# Patient Record
Sex: Female | Born: 1985 | Race: Black or African American | Hispanic: No | Marital: Single | Smoking: Current every day smoker
Health system: Southern US, Community
[De-identification: ages and names within clinical notes are randomized; demographics above are authoritative.]

## PROBLEM LIST (undated history)

## (undated) DIAGNOSIS — F29 Unspecified psychosis not due to a substance or known physiological condition: Secondary | ICD-10-CM

## (undated) DIAGNOSIS — F419 Anxiety disorder, unspecified: Secondary | ICD-10-CM

## (undated) DIAGNOSIS — F319 Bipolar disorder, unspecified: Secondary | ICD-10-CM

## (undated) DIAGNOSIS — F603 Borderline personality disorder: Secondary | ICD-10-CM

## (undated) DIAGNOSIS — I10 Essential (primary) hypertension: Secondary | ICD-10-CM

## (undated) DIAGNOSIS — M797 Fibromyalgia: Secondary | ICD-10-CM

## (undated) DIAGNOSIS — B259 Cytomegaloviral disease, unspecified: Secondary | ICD-10-CM

## (undated) DIAGNOSIS — G43909 Migraine, unspecified, not intractable, without status migrainosus: Secondary | ICD-10-CM

## (undated) DIAGNOSIS — F3181 Bipolar II disorder: Secondary | ICD-10-CM

## (undated) HISTORY — PX: TUBAL LIGATION: SHX77

## (undated) HISTORY — PX: ABDOMINAL HYSTERECTOMY: SHX81

---

## 2013-03-26 ENCOUNTER — Emergency Department (HOSPITAL_COMMUNITY)
Admission: EM | Admit: 2013-03-26 | Discharge: 2013-03-26 | Disposition: A | Discharge status: Home / Self Care | Payer: Medicaid Other | Attending: Emergency Medicine | Admitting: Emergency Medicine

## 2013-03-26 ENCOUNTER — Encounter (HOSPITAL_COMMUNITY): Payer: Self-pay | Admitting: Emergency Medicine

## 2013-03-26 DIAGNOSIS — F17200 Nicotine dependence, unspecified, uncomplicated: Secondary | ICD-10-CM | POA: Insufficient documentation

## 2013-03-26 DIAGNOSIS — I10 Essential (primary) hypertension: Secondary | ICD-10-CM | POA: Insufficient documentation

## 2013-03-26 DIAGNOSIS — Z91040 Latex allergy status: Secondary | ICD-10-CM | POA: Insufficient documentation

## 2013-03-26 DIAGNOSIS — G43909 Migraine, unspecified, not intractable, without status migrainosus: Secondary | ICD-10-CM | POA: Insufficient documentation

## 2013-03-26 DIAGNOSIS — F319 Bipolar disorder, unspecified: Secondary | ICD-10-CM | POA: Insufficient documentation

## 2013-03-26 DIAGNOSIS — F411 Generalized anxiety disorder: Secondary | ICD-10-CM | POA: Insufficient documentation

## 2013-03-26 DIAGNOSIS — Z79899 Other long term (current) drug therapy: Secondary | ICD-10-CM | POA: Insufficient documentation

## 2013-03-26 HISTORY — DX: Unspecified psychosis not due to a substance or known physiological condition: F29

## 2013-03-26 HISTORY — DX: Migraine, unspecified, not intractable, without status migrainosus: G43.909

## 2013-03-26 HISTORY — DX: Essential (primary) hypertension: I10

## 2013-03-26 HISTORY — DX: Fibromyalgia: M79.7

## 2013-03-26 HISTORY — DX: Bipolar II disorder: F31.81

## 2013-03-26 HISTORY — DX: Borderline personality disorder: F60.3

## 2013-03-26 HISTORY — DX: Bipolar disorder, unspecified: F31.9

## 2013-03-26 HISTORY — DX: Anxiety disorder, unspecified: F41.9

## 2013-03-26 MED ORDER — METOCLOPRAMIDE HCL 5 MG/ML IJ SOLN
10.0000 mg | Freq: Once | INTRAMUSCULAR | Status: AC
  Administered 2013-03-26: 10 mg via INTRAVENOUS
  Filled 2013-03-26: qty 2

## 2013-03-26 MED ORDER — DIPHENHYDRAMINE HCL 50 MG/ML IJ SOLN
25.0000 mg | Freq: Once | INTRAMUSCULAR | Status: AC
  Administered 2013-03-26: 25 mg via INTRAVENOUS
  Filled 2013-03-26: qty 1

## 2013-03-26 MED ORDER — KETOROLAC TROMETHAMINE 30 MG/ML IJ SOLN
30.0000 mg | Freq: Once | INTRAMUSCULAR | Status: AC
  Administered 2013-03-26: 30 mg via INTRAVENOUS
  Filled 2013-03-26: qty 1

## 2013-03-26 MED ORDER — SODIUM CHLORIDE 0.9 % IV SOLN
INTRAVENOUS | Status: DC
  Administered 2013-03-26: 15:00:00 via INTRAVENOUS

## 2013-03-26 NOTE — ED Notes (Signed)
Pt c/o migraine x 3 days.  States that she vomited last night.

## 2013-03-26 NOTE — ED Notes (Signed)
MD at bedside. 

## 2013-03-26 NOTE — ED Provider Notes (Signed)
History    CSN: [id number] Arrival date & time 03/26/13  1244  First MD Initiated Contact with Patient 03/26/13 1414     Chief Complaint  Patient presents with  . Migraine   HPI Pt has history of migraines.  This episode started a few days ago.  She tried her medications without relief.  The headache is in the back of her head and moves around to her eyes.  She feels a pressure sensation.  No fever.  No neck pain.  No numbness or focal weakness.  The headache became worse so she came to the ED.  Past Medical History  Diagnosis Date  . Migraines   . Hypertension   . Anxiety   . Bipolar 2 disorder   . Borderline personality disorder   . Manic depression   . Psychosis   . Fibromyalgia    Past Surgical History  Procedure Laterality Date  . Tubal ligation    . Abdominal hysterectomy     History reviewed. No pertinent family history. History  Substance Use Topics  . Smoking status: Current Every Day Smoker -- 1.00 packs/day    Types: Cigarettes  . Smokeless tobacco: Not on file  . Alcohol Use: No   OB History   Grav Para Term Preterm Abortions TAB SAB Ect Mult Living                 Review of Systems  All other systems reviewed and are negative.    Allergies  Gabapentin; Lyrica; Solu-medrol; Topamax; and Latex  Home Medications   Current Outpatient Rx  Name  Route  Sig  Dispense  Refill  . albuterol (PROVENTIL HFA;VENTOLIN HFA) 108 (90 BASE) MCG/ACT inhaler   Inhalation   Inhale 2 puffs into the lungs every 6 (six) hours as needed for wheezing or shortness of breath.         . ALPRAZolam (XANAX) 1 MG tablet   Oral   Take 1 mg by mouth 3 (three) times daily as needed for anxiety.         . butalbital-acetaminophen-caffeine (FIORICET, ESGIC) 50-325-40 MG per tablet   Oral   Take 1 tablet by mouth 2 (two) times daily as needed for headache.         . hydrochlorothiazide (HYDRODIURIL) 25 MG tablet   Oral   Take 25 mg by mouth daily.         .  QUEtiapine (SEROQUEL) 300 MG tablet   Oral   Take 300 mg by mouth at bedtime.         Marland Kitchen tiZANidine (ZANAFLEX) 4 MG tablet   Oral   Take 4 mg by mouth every 6 (six) hours as needed (for muscle spasms).          BP 139/92  Pulse 99  Temp(Src) 98.8 F (37.1 C) (Oral)  Resp 18  SpO2 100% Physical Exam  Nursing note and vitals reviewed. Constitutional: She appears well-developed and well-nourished. No distress.  HENT:  Head: Normocephalic and atraumatic.  Right Ear: External ear normal.  Left Ear: External ear normal.  Eyes: Conjunctivae are normal. Right eye exhibits no discharge. Left eye exhibits no discharge. No scleral icterus.  Neck: Neck supple. No tracheal deviation present.  Cardiovascular: Normal rate, regular rhythm and intact distal pulses.   Pulmonary/Chest: Effort normal and breath sounds normal. No stridor. No respiratory distress. She has no wheezes. She has no rales.  Abdominal: Soft. Bowel sounds are normal. She exhibits no distension. There is  no tenderness. There is no rebound and no guarding.  Musculoskeletal: She exhibits no edema and no tenderness.  Neurological: She is alert. She has normal strength. No sensory deficit. Cranial nerve deficit:  no gross defecits noted. She exhibits normal muscle tone. She displays no seizure activity. Coordination normal.  Skin: Skin is warm and dry. No rash noted.  Psychiatric: She has a normal mood and affect.    ED Course  Procedures (including critical care time) Labs Reviewed - No data to display No results found. 1. Migraine     MDM  Pt improved after treatment with migraine cocktail.  Symptoms typical for her usual migraine headache.  At this time there does not appear to be any evidence of an acute emergency medical condition and the patient appears stable for discharge with appropriate outpatient follow up.   Celene Kras, MD 03/26/13 1537

## 2013-06-19 ENCOUNTER — Encounter (HOSPITAL_COMMUNITY): Payer: Self-pay

## 2013-06-19 ENCOUNTER — Emergency Department (HOSPITAL_COMMUNITY)
Admission: EM | Admit: 2013-06-19 | Discharge: 2013-06-19 | Disposition: A | Discharge status: Home / Self Care | Payer: Medicaid Other | Attending: Emergency Medicine | Admitting: Emergency Medicine

## 2013-06-19 DIAGNOSIS — R42 Dizziness and giddiness: Secondary | ICD-10-CM | POA: Insufficient documentation

## 2013-06-19 DIAGNOSIS — Z79899 Other long term (current) drug therapy: Secondary | ICD-10-CM | POA: Insufficient documentation

## 2013-06-19 DIAGNOSIS — F3189 Other bipolar disorder: Secondary | ICD-10-CM | POA: Insufficient documentation

## 2013-06-19 DIAGNOSIS — Z91040 Latex allergy status: Secondary | ICD-10-CM | POA: Insufficient documentation

## 2013-06-19 DIAGNOSIS — F17200 Nicotine dependence, unspecified, uncomplicated: Secondary | ICD-10-CM | POA: Insufficient documentation

## 2013-06-19 DIAGNOSIS — Z8619 Personal history of other infectious and parasitic diseases: Secondary | ICD-10-CM | POA: Insufficient documentation

## 2013-06-19 DIAGNOSIS — F411 Generalized anxiety disorder: Secondary | ICD-10-CM | POA: Insufficient documentation

## 2013-06-19 DIAGNOSIS — G43109 Migraine with aura, not intractable, without status migrainosus: Secondary | ICD-10-CM | POA: Insufficient documentation

## 2013-06-19 DIAGNOSIS — I10 Essential (primary) hypertension: Secondary | ICD-10-CM | POA: Insufficient documentation

## 2013-06-19 DIAGNOSIS — R112 Nausea with vomiting, unspecified: Secondary | ICD-10-CM | POA: Insufficient documentation

## 2013-06-19 DIAGNOSIS — Z8739 Personal history of other diseases of the musculoskeletal system and connective tissue: Secondary | ICD-10-CM | POA: Insufficient documentation

## 2013-06-19 HISTORY — DX: Cytomegaloviral disease, unspecified: B25.9

## 2013-06-19 MED ORDER — SODIUM CHLORIDE 0.9 % IV BOLUS (SEPSIS)
1000.0000 mL | Freq: Once | INTRAVENOUS | Status: AC
  Administered 2013-06-19: 1000 mL via INTRAVENOUS

## 2013-06-19 MED ORDER — KETOROLAC TROMETHAMINE 30 MG/ML IJ SOLN
30.0000 mg | Freq: Once | INTRAMUSCULAR | Status: AC
  Administered 2013-06-19: 30 mg via INTRAVENOUS
  Filled 2013-06-19: qty 1

## 2013-06-19 MED ORDER — DIPHENHYDRAMINE HCL 50 MG/ML IJ SOLN
25.0000 mg | Freq: Once | INTRAMUSCULAR | Status: AC
  Administered 2013-06-19: 25 mg via INTRAVENOUS
  Filled 2013-06-19: qty 1

## 2013-06-19 MED ORDER — METOCLOPRAMIDE HCL 5 MG/ML IJ SOLN
10.0000 mg | Freq: Once | INTRAMUSCULAR | Status: AC
  Administered 2013-06-19: 10 mg via INTRAVENOUS
  Filled 2013-06-19: qty 2

## 2013-06-19 MED ORDER — BUTALBITAL-APAP-CAFFEINE 50-325-40 MG PO TABS: 1.0000 | ORAL_TABLET | Freq: Two times a day (BID) | ORAL | Status: DC | PRN

## 2013-06-19 NOTE — ED Provider Notes (Signed)
CSN: [id number]     Arrival date & time 06/19/13  1834 History   First MD Initiated Contact with Patient 06/19/13 1918     Chief Complaint  Patient presents with  . Migraine  . Emesis   (Consider location/radiation/quality/duration/timing/severity/associated sxs/prior Treatment) Patient is a 27 y.o. female presenting with migraines and vomiting. The history is provided by the patient. No language interpreter was used.  Migraine This is a recurrent problem. The current episode started yesterday. The problem occurs every several days. The problem has been gradually worsening. Associated symptoms include nausea and vomiting. Pertinent negatives include no abdominal pain. She has tried acetaminophen for the symptoms. The treatment provided no relief.  Emesis Severity:  Moderate Duration:  1 day Number of daily episodes:  5 Quality:  Bilious material Able to tolerate:  Liquids How soon after eating does vomiting occur:  10 minutes Recent urination:  Normal Worsened by:  Food smell Associated symptoms: no abdominal pain and no fever   Risk factors: no sick contacts and no travel to endemic areas    Pt is a 27 year old female who presents with a 1 day history of a headache. She reports that she has a history of migraine headaches and started with a unilateral headache yesterday hurting behind her left eye. She reports that she did have an aura, which she described as flashes of light preceding this headache. She has also had associated episodic vomiting. She reports that she has been off of her normal migraine meds due to her medicaid renewal and she has not taken them since July. She has noticed an increase in headaches since she has been off of her meds. She denies any other visual changes or changes in her visual acuity. She denies diarrhea, fever, neck stiffness or recent sick exposure. She reports her pain now to be bilateral, frontal and extending over her head to the back. She denies pain or  sensitivity in her temples. She says that her triggers include stress and lack of sleep. No known food triggers.      Past Medical History  Diagnosis Date  . Migraines   . Hypertension   . Anxiety   . Bipolar 2 disorder   . Borderline personality disorder   . Manic depression   . Psychosis   . Fibromyalgia   . Cytomegalovirus (CMV) viremia    Past Surgical History  Procedure Laterality Date  . Tubal ligation    . Abdominal hysterectomy     History reviewed. No pertinent family history. History  Substance Use Topics  . Smoking status: Current Every Day Smoker -- 1.00 packs/day    Types: Cigarettes  . Smokeless tobacco: Not on file  . Alcohol Use: No   OB History   Grav Para Term Preterm Abortions TAB SAB Ect Mult Living                 Review of Systems  Gastrointestinal: Positive for nausea and vomiting. Negative for abdominal pain.  Neurological: Positive for dizziness.  All other systems reviewed and are negative.    Allergies  Gabapentin; Lyrica; Solu-medrol; Topamax; and Latex  Home Medications   Current Outpatient Rx  Name  Route  Sig  Dispense  Refill  . albuterol (PROVENTIL HFA;VENTOLIN HFA) 108 (90 BASE) MCG/ACT inhaler   Inhalation   Inhale 2 puffs into the lungs every 6 (six) hours as needed for wheezing or shortness of breath.         . ALPRAZolam Prudy Feeler)  1 MG tablet   Oral   Take 1 mg by mouth 3 (three) times daily as needed for anxiety.         . butalbital-acetaminophen-caffeine (FIORICET, ESGIC) 50-325-40 MG per tablet   Oral   Take 1 tablet by mouth 2 (two) times daily as needed for headache.         . hydrochlorothiazide (HYDRODIURIL) 25 MG tablet   Oral   Take 25 mg by mouth daily.         . QUEtiapine (SEROQUEL) 300 MG tablet   Oral   Take 300 mg by mouth at bedtime.         Marland Kitchen tiZANidine (ZANAFLEX) 4 MG tablet   Oral   Take 4 mg by mouth every 6 (six) hours as needed (for muscle spasms).          There were no  vitals taken for this visit. Physical Exam  ED Course  Procedures (including critical care time) Labs Review Labs Reviewed - No data to display Imaging Review No results found.  MDM   1. Migraine with aura     Presents with typical pattern of migraines has been off of her regular meds since July. Relieved by migraine cocktail here in ER. Neuro exam within normal limits. No changes in mental status, no numbness, focal weakness, fever or stiff neck. Gait and coordination normal. Headache was preceded by aura with flashing lights. Otherwise, no visual changes. Reassuring exam.        Irish Elders, NP 06/19/13 2100

## 2013-06-19 NOTE — ED Provider Notes (Signed)
Medical screening examination/treatment/procedure(s) were conducted as a shared visit with non-physician practitioner(s) and myself.  I personally evaluated the patient during the encounter  Please see my separate respective documentation pertaining to this patient encounter   Vida Roller, MD 06/19/13 2117

## 2013-06-19 NOTE — ED Provider Notes (Signed)
27 year old female with a history of frequent headaches presents with a complaint of a headache behind her left eye radiating over the back of her head to her neck. There is no stiff neck, no fever, no chills. She does have associated photophobia as well as nausea but on my exam appears very comfortable, is actively using her cell phone, has her legs crossed, fallows all my commands, is smiling and interactive during the exam. There is no meningismus, soft abdomen, clear heart and lung sounds, normal strength and sensation of all 4 extremities and normal cranial nerves III through XII. The patient does appear stable for discharge after getting some symptomatic medications and a refill on her Fioricet which she states she is out of.  Medical screening examination/treatment/procedure(s) were conducted as a shared visit with non-physician practitioner(s) and myself.  I personally evaluated the patient during the encounter.  Clinical Impression: Headache      Vida Roller, MD 06/19/13 2010

## 2013-06-19 NOTE — ED Notes (Signed)
Pt reports intermittent vomiting and migraine headache x3 weeks, woke up this am with tremors. Pt reports she is currently waiting for her Medicaid to be renewed and has been out of her migraine medication

## 2013-06-27 ENCOUNTER — Emergency Department (HOSPITAL_COMMUNITY)
Admission: EM | Admit: 2013-06-27 | Discharge: 2013-06-28 | Disposition: A | Discharge status: Home / Self Care | Payer: Medicaid Other | Attending: Emergency Medicine | Admitting: Emergency Medicine

## 2013-06-27 DIAGNOSIS — IMO0001 Reserved for inherently not codable concepts without codable children: Secondary | ICD-10-CM | POA: Insufficient documentation

## 2013-06-27 DIAGNOSIS — F411 Generalized anxiety disorder: Secondary | ICD-10-CM | POA: Insufficient documentation

## 2013-06-27 DIAGNOSIS — N83209 Unspecified ovarian cyst, unspecified side: Secondary | ICD-10-CM | POA: Insufficient documentation

## 2013-06-27 DIAGNOSIS — I10 Essential (primary) hypertension: Secondary | ICD-10-CM | POA: Insufficient documentation

## 2013-06-27 DIAGNOSIS — R509 Fever, unspecified: Secondary | ICD-10-CM | POA: Insufficient documentation

## 2013-06-27 DIAGNOSIS — F3189 Other bipolar disorder: Secondary | ICD-10-CM | POA: Insufficient documentation

## 2013-06-27 DIAGNOSIS — R1032 Left lower quadrant pain: Secondary | ICD-10-CM | POA: Insufficient documentation

## 2013-06-27 DIAGNOSIS — Z8619 Personal history of other infectious and parasitic diseases: Secondary | ICD-10-CM | POA: Insufficient documentation

## 2013-06-27 DIAGNOSIS — Z79899 Other long term (current) drug therapy: Secondary | ICD-10-CM | POA: Insufficient documentation

## 2013-06-27 DIAGNOSIS — R112 Nausea with vomiting, unspecified: Secondary | ICD-10-CM | POA: Insufficient documentation

## 2013-06-27 DIAGNOSIS — Z90710 Acquired absence of both cervix and uterus: Secondary | ICD-10-CM | POA: Insufficient documentation

## 2013-06-27 DIAGNOSIS — R5381 Other malaise: Secondary | ICD-10-CM | POA: Insufficient documentation

## 2013-06-27 DIAGNOSIS — M549 Dorsalgia, unspecified: Secondary | ICD-10-CM | POA: Insufficient documentation

## 2013-06-27 DIAGNOSIS — F603 Borderline personality disorder: Secondary | ICD-10-CM | POA: Insufficient documentation

## 2013-06-27 DIAGNOSIS — Z91040 Latex allergy status: Secondary | ICD-10-CM | POA: Insufficient documentation

## 2013-06-27 DIAGNOSIS — R630 Anorexia: Secondary | ICD-10-CM | POA: Insufficient documentation

## 2013-06-27 DIAGNOSIS — R51 Headache: Secondary | ICD-10-CM | POA: Insufficient documentation

## 2013-06-27 DIAGNOSIS — F17200 Nicotine dependence, unspecified, uncomplicated: Secondary | ICD-10-CM | POA: Insufficient documentation

## 2013-06-28 ENCOUNTER — Emergency Department (HOSPITAL_COMMUNITY): Payer: Medicaid Other

## 2013-06-28 ENCOUNTER — Encounter (HOSPITAL_COMMUNITY): Payer: Self-pay | Admitting: Emergency Medicine

## 2013-06-28 LAB — CBC WITH DIFFERENTIAL/PLATELET
Basophils Relative: 0 % (ref 0–1)
Eosinophils Absolute: 0.2 10*3/uL (ref 0.0–0.7)
Eosinophils Relative: 3 % (ref 0–5)
HCT: 37.4 % (ref 36.0–46.0)
Hemoglobin: 13.2 g/dL (ref 12.0–15.0)
MCH: 31.4 pg (ref 26.0–34.0)
MCHC: 35.3 g/dL (ref 30.0–36.0)
MCV: 89 fL (ref 78.0–100.0)
Monocytes Absolute: 0.4 10*3/uL (ref 0.1–1.0)
Monocytes Relative: 5 % (ref 3–12)

## 2013-06-28 LAB — COMPREHENSIVE METABOLIC PANEL
Albumin: 3.6 g/dL (ref 3.5–5.2)
BUN: 11 mg/dL (ref 6–23)
Creatinine, Ser: 1.01 mg/dL (ref 0.50–1.10)
GFR calc Af Amer: 87 mL/min — ABNORMAL LOW (ref >90)
Glucose, Bld: 88 mg/dL (ref 70–99)
Total Protein: 6.7 g/dL (ref 6.0–8.3)

## 2013-06-28 LAB — LIPASE, BLOOD: Lipase: 39 U/L (ref 11–59)

## 2013-06-28 LAB — URINALYSIS, ROUTINE W REFLEX MICROSCOPIC
Ketones, ur: NEGATIVE mg/dL
Protein, ur: NEGATIVE mg/dL
Specific Gravity, Urine: 1.021 (ref 1.005–1.030)
pH: 8 (ref 5.0–8.0)

## 2013-06-28 LAB — GC/CHLAMYDIA PROBE AMP: CT Probe RNA: NEGATIVE

## 2013-06-28 LAB — WET PREP, GENITAL
Trich, Wet Prep: NONE SEEN
Yeast Wet Prep HPF POC: NONE SEEN

## 2013-06-28 MED ORDER — DIPHENHYDRAMINE HCL 50 MG/ML IJ SOLN
12.5000 mg | Freq: Once | INTRAMUSCULAR | Status: AC
  Administered 2013-06-28: 12.5 mg via INTRAVENOUS
  Filled 2013-06-28: qty 1

## 2013-06-28 MED ORDER — HYDROMORPHONE HCL PF 1 MG/ML IJ SOLN
1.0000 mg | Freq: Once | INTRAMUSCULAR | Status: AC
  Administered 2013-06-28: 1 mg via INTRAVENOUS
  Filled 2013-06-28: qty 1

## 2013-06-28 MED ORDER — ONDANSETRON HCL 4 MG/2ML IJ SOLN
4.0000 mg | Freq: Once | INTRAMUSCULAR | Status: AC
  Administered 2013-06-28: 4 mg via INTRAVENOUS
  Filled 2013-06-28: qty 2

## 2013-06-28 MED ORDER — OXYCODONE-ACETAMINOPHEN 5-325 MG PO TABS: 1.0000 | ORAL_TABLET | Freq: Four times a day (QID) | ORAL | Status: DC | PRN

## 2013-06-28 NOTE — ED Notes (Signed)
Pt states for the past 3 weeks everytime she eats anything it makes her vomit  Pt states she has not eaten anything at all today  Pt states she feels tired and all she wants to do is sleep  Pt states she was seen by her dr today and they drew blood but did not get the results yet  Pt states she is having pain in her left upper quadrant and is having pain in her right lower quadrant  Pt states the pain runs down her right leg  Pt states she has been running a fever today  Pt states the back of her neck hurts as well

## 2013-06-28 NOTE — ED Provider Notes (Signed)
CSN: [id number]     Arrival date & time 06/27/13  2243 History   First MD Initiated Contact with Patient 06/28/13 0003     Chief Complaint  Patient presents with  . Emesis   (Consider location/radiation/quality/duration/timing/severity/associated sxs/prior Treatment) Patient is a 27 y.o. female presenting with abdominal pain. The history is provided by the patient.  Abdominal Pain Pain location:  LUQ and LLQ Pain quality: aching, sharp and stabbing   Pain radiates to:  Does not radiate Pain severity:  Moderate Onset quality:  Gradual Duration:  2 weeks Timing:  Constant Progression:  Worsening Chronicity:  New Context: eating and recent illness   Context: not alcohol use, not awakening from sleep, not diet changes and not recent sexual activity   Context comment:  Having diffuse myalgias today and went to PCP office and found to have temp of 103.  Blood work and swabs done.  Swabs neg but blood pending. Relieved by:  Nothing Worsened by:  Eating, vomiting and movement Ineffective treatments:  Acetaminophen Associated symptoms: anorexia, fatigue, fever, nausea and vomiting   Associated symptoms: no chest pain, no cough, no diarrhea, no dysuria, no shortness of breath, no sore throat, no vaginal bleeding and no vaginal discharge   Associated symptoms comment:  Worsening migraine headaches and pain in the neck.  Fever today of 103 and then 104 that resolved with tylenol and motrin Risk factors: no alcohol abuse, no aspirin use, has not had multiple surgeries and not pregnant     Past Medical History  Diagnosis Date  . Migraines   . Hypertension   . Anxiety   . Bipolar 2 disorder   . Borderline personality disorder   . Manic depression   . Psychosis   . Fibromyalgia   . Cytomegalovirus (CMV) viremia    Past Surgical History  Procedure Laterality Date  . Tubal ligation    . Abdominal hysterectomy     History reviewed. No pertinent family history. History  Substance Use  Topics  . Smoking status: Current Every Day Smoker -- 1.00 packs/day    Types: Cigarettes  . Smokeless tobacco: Not on file  . Alcohol Use: No   OB History   Grav Para Term Preterm Abortions TAB SAB Ect Mult Living                 Review of Systems  Constitutional: Positive for fever and fatigue.  HENT: Negative for sore throat.   Respiratory: Negative for cough and shortness of breath.   Cardiovascular: Negative for chest pain.  Gastrointestinal: Positive for nausea, vomiting, abdominal pain and anorexia. Negative for diarrhea.  Genitourinary: Negative for dysuria, vaginal bleeding and vaginal discharge.  Musculoskeletal: Positive for myalgias and back pain.  Neurological: Positive for headaches. Negative for weakness.  All other systems reviewed and are negative.    Allergies  Gabapentin; Lyrica; Solu-medrol; Topamax; and Latex  Home Medications   Current Outpatient Rx  Name  Route  Sig  Dispense  Refill  . albuterol (PROVENTIL HFA;VENTOLIN HFA) 108 (90 BASE) MCG/ACT inhaler   Inhalation   Inhale 2 puffs into the lungs every 6 (six) hours as needed for wheezing or shortness of breath.         . ALPRAZolam (XANAX) 1 MG tablet   Oral   Take 1 mg by mouth 3 (three) times daily as needed for anxiety.         . butalbital-acetaminophen-caffeine (FIORICET, ESGIC) 50-325-40 MG per tablet   Oral   Take  1 tablet by mouth 2 (two) times daily as needed for headache.   30 tablet   1   . QUEtiapine (SEROQUEL) 300 MG tablet   Oral   Take 300 mg by mouth at bedtime.         Marland Kitchen tiZANidine (ZANAFLEX) 4 MG tablet   Oral   Take 4 mg by mouth every 6 (six) hours as needed (for muscle spasms).         . traMADol (ULTRAM) 50 MG tablet   Oral   Take 50 mg by mouth 3 (three) times daily as needed for pain (back pain).           BP 129/80  Pulse 104  Temp(Src) 99.6 F (37.6 C) (Oral)  Resp 15  Ht 5\' 6"  (1.676 m)  Wt 184 lb (83.462 kg)  BMI 29.71 kg/m2  SpO2  98% Physical Exam  Nursing note and vitals reviewed. Constitutional: She is oriented to person, place, and time. She appears well-developed and well-nourished. No distress.  HENT:  Head: Normocephalic and atraumatic.  Mouth/Throat: Oropharynx is clear and moist.  Eyes: Conjunctivae and EOM are normal. Pupils are equal, round, and reactive to light.  Neck: Normal range of motion. Neck supple. Muscular tenderness present. No spinous process tenderness present. No rigidity. Normal range of motion present. No Brudzinski's sign and no Kernig's sign noted.  Pt is able to fully flex and extend the neck without difficulty or distress  Cardiovascular: Normal rate, regular rhythm and intact distal pulses.   No murmur heard. Pulmonary/Chest: Effort normal and breath sounds normal. No respiratory distress. She has no wheezes. She has no rales.  Abdominal: Soft. She exhibits no distension. There is tenderness in the left upper quadrant and left lower quadrant. There is no rebound, no guarding and no CVA tenderness.    Musculoskeletal: Normal range of motion. She exhibits no edema.       Cervical back: She exhibits tenderness. She exhibits no bony tenderness.       Back:  Neurological: She is alert and oriented to person, place, and time.  Skin: Skin is warm and dry. No rash noted. No erythema.  Psychiatric: She has a normal mood and affect. Her behavior is normal.    ED Course  Procedures (including critical care time) Labs Review Labs Reviewed  WET PREP, GENITAL - Abnormal; Notable for the following:    Clue Cells Wet Prep HPF POC MANY (*)    WBC, Wet Prep HPF POC RARE (*)    All other components within normal limits  COMPREHENSIVE METABOLIC PANEL - Abnormal; Notable for the following:    Total Bilirubin 0.2 (*)    GFR calc non Af Amer 75 (*)    GFR calc Af Amer 87 (*)    All other components within normal limits  URINALYSIS, ROUTINE W REFLEX MICROSCOPIC - Abnormal; Notable for the  following:    APPearance CLOUDY (*)    All other components within normal limits  GC/CHLAMYDIA PROBE AMP  CBC WITH DIFFERENTIAL  LIPASE, BLOOD   Imaging Review US Transvaginal Non-ob  06/28/2013   CLINICAL DATA:  Left ovarian pain, question torsion  EXAM: TRANSABDOMINAL AND TRANSVAGINAL ULTRASOUND OF PELVIS  DOPPLER ULTRASOUND OF OVARIES  TECHNIQUE: Both transabdominal and transvaginal ultrasound examinations of the pelvis were performed. Transabdominal technique was performed for global imaging of the pelvis including uterus, ovaries, adnexal regions, and pelvic cul-de-sac.  It was necessary to proceed with endovaginal exam following the transabdominal exam to  visualize the ovaries and adnexa. Color and duplex Doppler ultrasound was utilized to evaluate blood flow to the ovaries.  COMPARISON:  CT 02/05/2013  FINDINGS: Uterus  Measurements: Prior hysterectomy. No fibroids or other mass visualized.  Endometrium  Thickness: N/A  No focal abnormality visualized.  Right ovary  Measurements: 3.2 x 2.5 x 2.4 cm. Small follicles. Normal size and echotexture. No adnexal masses.  Left ovary  Measurements: 3.0 x 2.0 x 1.5 cm. Multiple small follicles. Normal size and echotexture. No adnexal masses.  Pulsed Doppler evaluation of both ovaries demonstrates normal low-resistance arterial and venous waveforms.  Other findings  Moderate complex free fluid noted in the pelvis.  IMPRESSION: Moderate complex free fluid in the pelvis. While nonspecific, this could be related to ruptured ovarian cyst. No evidence of ovarian torsion. Prior hysterectomy.  No sonographic evidence for ovarian torsion.   Electronically Signed   By: Charlett Nose M.D.   On: 06/28/2013 03:33   US Pelvis Complete  06/28/2013   CLINICAL DATA:  Left ovarian pain, question torsion  EXAM: TRANSABDOMINAL AND TRANSVAGINAL ULTRASOUND OF PELVIS  DOPPLER ULTRASOUND OF OVARIES  TECHNIQUE: Both transabdominal and transvaginal ultrasound examinations of the  pelvis were performed. Transabdominal technique was performed for global imaging of the pelvis including uterus, ovaries, adnexal regions, and pelvic cul-de-sac.  It was necessary to proceed with endovaginal exam following the transabdominal exam to visualize the ovaries and adnexa. Color and duplex Doppler ultrasound was utilized to evaluate blood flow to the ovaries.  COMPARISON:  CT 02/05/2013  FINDINGS: Uterus  Measurements: Prior hysterectomy. No fibroids or other mass visualized.  Endometrium  Thickness: N/A  No focal abnormality visualized.  Right ovary  Measurements: 3.2 x 2.5 x 2.4 cm. Small follicles. Normal size and echotexture. No adnexal masses.  Left ovary  Measurements: 3.0 x 2.0 x 1.5 cm. Multiple small follicles. Normal size and echotexture. No adnexal masses.  Pulsed Doppler evaluation of both ovaries demonstrates normal low-resistance arterial and venous waveforms.  Other findings  Moderate complex free fluid noted in the pelvis.  IMPRESSION: Moderate complex free fluid in the pelvis. While nonspecific, this could be related to ruptured ovarian cyst. No evidence of ovarian torsion. Prior hysterectomy.  No sonographic evidence for ovarian torsion.   Electronically Signed   By: Charlett Nose M.D.   On: 06/28/2013 03:33   [place] Art/ven Flow Abd Pelv Doppler  06/28/2013   CLINICAL DATA:  Left ovarian pain, question torsion  EXAM: TRANSABDOMINAL AND TRANSVAGINAL ULTRASOUND OF PELVIS  DOPPLER ULTRASOUND OF OVARIES  TECHNIQUE: Both transabdominal and transvaginal ultrasound examinations of the pelvis were performed. Transabdominal technique was performed for global imaging of the pelvis including uterus, ovaries, adnexal regions, and pelvic cul-de-sac.  It was necessary to proceed with endovaginal exam following the transabdominal exam to visualize the ovaries and adnexa. Color and duplex Doppler ultrasound was utilized to evaluate blood flow to the ovaries.  COMPARISON:  CT 02/05/2013  FINDINGS: Uterus   Measurements: Prior hysterectomy. No fibroids or other mass visualized.  Endometrium  Thickness: N/A  No focal abnormality visualized.  Right ovary  Measurements: 3.2 x 2.5 x 2.4 cm. Small follicles. Normal size and echotexture. No adnexal masses.  Left ovary  Measurements: 3.0 x 2.0 x 1.5 cm. Multiple small follicles. Normal size and echotexture. No adnexal masses.  Pulsed Doppler evaluation of both ovaries demonstrates normal low-resistance arterial and venous waveforms.  Other findings  Moderate complex free fluid noted in the pelvis.  IMPRESSION: Moderate complex free  fluid in the pelvis. While nonspecific, this could be related to ruptured ovarian cyst. No evidence of ovarian torsion. Prior hysterectomy.  No sonographic evidence for ovarian torsion.   Electronically Signed   By: Charlett Nose M.D.   On: 06/28/2013 03:33    MDM  No diagnosis found.  Patient here recently for complaints of frequent migraines with a history of migraine and treated appropriately however returns today after seeing her PCP for worsening headache, diffuse body aches and left-sided pelvic and upper abdominal pain with vomiting. She states she's been unable to hold anything down but denies any dysuria, vaginal discharge or diarrhea. Patient states that her doctor's office today she had a throat swab time a nasal swab, and blood tests drawn. The swabs were negative she states in the past she's had a history of the CMV virus that caused similar symptoms. She is complaining of pain in bilateral trapezius muscles and some paracervical tenderness but has no signs of meningitis. However today she had a temperature of 103 at the doctor's office and then when she got home later that day she had a temperature of 104 for which she took Tylenol and ibuprofen the temperature is defervesced here. She denies any tick exposure low suspicion for [facility] spotted fever or Lyme disease at this time. Patient does have a history of ovarian  problems in July of this year and states that her symptoms today feel similar. On exam she does have left pelvic and left upper quadrant pain.  Low suspicion for meningitis at this time but we'll give pain control for headache. Also will do pelvic exam to further evaluate the abdominal pain as well as CBC, CMP and lipase. Wet prep and GC and Chlamydia pending.  Pt also given nausea meds and fluids.  On pelvic pt had left ovarian tenderness and U/S done for further eval.  3:37 AM Labs all wnl.  UA neg.  U/S showed moderate free fluid in the pelvis most consistent with ruptured ovarian cyst which would explain the pelvic tenderness and vomiting without diarrhea.  FUO but fever has only been for 1 day.  Pt saw PCP and is to return on Monday for f/u on labs.  Will treat pain at this time and have f/u with PCP.  Gwyneth Sprout, MD 06/28/13 [phone]

## 2013-07-17 ENCOUNTER — Emergency Department (HOSPITAL_COMMUNITY)
Admission: EM | Admit: 2013-07-17 | Discharge: 2013-07-17 | Disposition: A | Discharge status: Home / Self Care | Payer: Medicaid Other | Attending: Emergency Medicine | Admitting: Emergency Medicine

## 2013-07-17 DIAGNOSIS — F411 Generalized anxiety disorder: Secondary | ICD-10-CM | POA: Insufficient documentation

## 2013-07-17 DIAGNOSIS — G43909 Migraine, unspecified, not intractable, without status migrainosus: Secondary | ICD-10-CM | POA: Insufficient documentation

## 2013-07-17 DIAGNOSIS — Z79899 Other long term (current) drug therapy: Secondary | ICD-10-CM | POA: Insufficient documentation

## 2013-07-17 DIAGNOSIS — G589 Mononeuropathy, unspecified: Secondary | ICD-10-CM | POA: Insufficient documentation

## 2013-07-17 DIAGNOSIS — F17200 Nicotine dependence, unspecified, uncomplicated: Secondary | ICD-10-CM | POA: Insufficient documentation

## 2013-07-17 DIAGNOSIS — R209 Unspecified disturbances of skin sensation: Secondary | ICD-10-CM | POA: Insufficient documentation

## 2013-07-17 DIAGNOSIS — R5381 Other malaise: Secondary | ICD-10-CM | POA: Insufficient documentation

## 2013-07-17 DIAGNOSIS — Z3202 Encounter for pregnancy test, result negative: Secondary | ICD-10-CM | POA: Insufficient documentation

## 2013-07-17 DIAGNOSIS — F319 Bipolar disorder, unspecified: Secondary | ICD-10-CM | POA: Insufficient documentation

## 2013-07-17 DIAGNOSIS — A6920 Lyme disease, unspecified: Secondary | ICD-10-CM | POA: Insufficient documentation

## 2013-07-17 DIAGNOSIS — G629 Polyneuropathy, unspecified: Secondary | ICD-10-CM

## 2013-07-17 DIAGNOSIS — I10 Essential (primary) hypertension: Secondary | ICD-10-CM | POA: Insufficient documentation

## 2013-07-17 DIAGNOSIS — Z91040 Latex allergy status: Secondary | ICD-10-CM | POA: Insufficient documentation

## 2013-07-17 LAB — URINALYSIS, ROUTINE W REFLEX MICROSCOPIC
Hgb urine dipstick: NEGATIVE
Leukocytes, UA: NEGATIVE
Nitrite: NEGATIVE
Protein, ur: NEGATIVE mg/dL
Specific Gravity, Urine: 1.027 (ref 1.005–1.030)
Urobilinogen, UA: 1 mg/dL (ref 0.0–1.0)
pH: 5.5 (ref 5.0–8.0)

## 2013-07-17 LAB — CBC WITH DIFFERENTIAL/PLATELET
Basophils Absolute: 0 10*3/uL (ref 0.0–0.1)
Basophils Relative: 0 % (ref 0–1)
Eosinophils Absolute: 0.2 10*3/uL (ref 0.0–0.7)
Hemoglobin: 13.2 g/dL (ref 12.0–15.0)
Lymphocytes Relative: 42 % (ref 12–46)
MCH: 29.8 pg (ref 26.0–34.0)
MCHC: 33.6 g/dL (ref 30.0–36.0)
Monocytes Relative: 5 % (ref 3–12)
Neutro Abs: 3.3 10*3/uL (ref 1.7–7.7)
Neutrophils Relative %: 50 % (ref 43–77)
Platelets: 241 10*3/uL (ref 150–400)
RDW: 12.1 % (ref 11.5–15.5)
WBC: 6.7 10*3/uL (ref 4.0–10.5)

## 2013-07-17 LAB — COMPREHENSIVE METABOLIC PANEL
ALT: 11 U/L (ref 0–35)
Albumin: 3.9 g/dL (ref 3.5–5.2)
Alkaline Phosphatase: 48 U/L (ref 39–117)
BUN: 13 mg/dL (ref 6–23)
CO2: 25 mEq/L (ref 19–32)
Calcium: 9.9 mg/dL (ref 8.4–10.5)
Creatinine, Ser: 0.75 mg/dL (ref 0.50–1.10)
GFR calc Af Amer: >90 mL/min (ref >90)
GFR calc non Af Amer: >90 mL/min (ref >90)
Glucose, Bld: 79 mg/dL (ref 70–99)
Sodium: 133 mEq/L — ABNORMAL LOW (ref 135–145)

## 2013-07-17 LAB — POCT PREGNANCY, URINE: Preg Test, Ur: NEGATIVE

## 2013-07-17 MED ORDER — DOXYCYCLINE HYCLATE 100 MG PO TABS
100.0000 mg | ORAL_TABLET | Freq: Once | ORAL | Status: AC
  Administered 2013-07-17: 100 mg via ORAL
  Filled 2013-07-17: qty 1

## 2013-07-17 MED ORDER — SODIUM CHLORIDE 0.9 % IV BOLUS (SEPSIS)
1000.0000 mL | Freq: Once | INTRAVENOUS | Status: AC
  Administered 2013-07-17: 1000 mL via INTRAVENOUS

## 2013-07-17 MED ORDER — METOCLOPRAMIDE HCL 5 MG/ML IJ SOLN
10.0000 mg | Freq: Once | INTRAMUSCULAR | Status: AC
Start: 2013-07-17 — End: 2013-07-17
  Administered 2013-07-17: 10 mg via INTRAVENOUS
  Filled 2013-07-17: qty 2

## 2013-07-17 MED ORDER — PROMETHAZINE HCL 25 MG PO TABS: 25.0000 mg | ORAL_TABLET | Freq: Four times a day (QID) | ORAL | Status: DC | PRN

## 2013-07-17 MED ORDER — DOXYCYCLINE HYCLATE 100 MG PO CAPS: 100.0000 mg | ORAL_CAPSULE | Freq: Two times a day (BID) | ORAL | Status: DC

## 2013-07-17 MED ORDER — IBUPROFEN 400 MG PO TABS: 400.0000 mg | ORAL_TABLET | Freq: Four times a day (QID) | ORAL | Status: DC | PRN

## 2013-07-17 MED ORDER — DIPHENHYDRAMINE HCL 50 MG/ML IJ SOLN
25.0000 mg | Freq: Once | INTRAMUSCULAR | Status: AC
  Administered 2013-07-17: 25 mg via INTRAVENOUS
  Filled 2013-07-17: qty 1

## 2013-07-17 MED ORDER — KETOROLAC TROMETHAMINE 30 MG/ML IJ SOLN
30.0000 mg | Freq: Once | INTRAMUSCULAR | Status: AC
  Administered 2013-07-17: 30 mg via INTRAVENOUS
  Filled 2013-07-17: qty 1

## 2013-07-17 NOTE — ED Notes (Signed)
Pt states that she started having a migraine several weeks ago and her PCP told her to come in because her blood work showed Lyme's disease.

## 2013-07-17 NOTE — ED Provider Notes (Addendum)
CSN: [id number]     Arrival date & time 07/17/13  1832 History   First MD Initiated Contact with Patient 07/17/13 1922     Chief Complaint  Patient presents with  . Migraine  . Lyme's disease    (Consider location/radiation/quality/duration/timing/severity/associated sxs/prior Treatment) HPI Comments: PT comes in with cc of migraines and since her PCP asked her to come to the ER after a + lyme test. Hx of psych disorders and HTN. Apparently has been having some diffuse body aches, headache x 1 month, PCP ordered a LYME and it came back positive. Pt's headaches are diffuse, front to back. The pain is constant, similar to her migraine, throbbing, behind her eye. She has no new vision complains, + nausea with emesis x 4. Pt does indicate having some facial numbness every morning she wakes up. No confusion, no LOC.  Patient is a 27 y.o. female presenting with migraines. The history is provided by the patient.  Migraine Associated symptoms include headaches. Pertinent negatives include no chest pain, no abdominal pain and no shortness of breath.    Past Medical History  Diagnosis Date  . Migraines   . Hypertension   . Anxiety   . Bipolar 2 disorder   . Borderline personality disorder   . Manic depression   . Psychosis   . Fibromyalgia   . Cytomegalovirus (CMV) viremia    Past Surgical History  Procedure Laterality Date  . Tubal ligation    . Abdominal hysterectomy     No family history on file. History  Substance Use Topics  . Smoking status: Current Every Day Smoker -- 1.00 packs/day    Types: Cigarettes  . Smokeless tobacco: Not on file  . Alcohol Use: No   OB History   Grav Para Term Preterm Abortions TAB SAB Ect Mult Living                 Review of Systems  Constitutional: Positive for fatigue. Negative for fever and activity change.  Respiratory: Negative for shortness of breath.   Cardiovascular: Negative for chest pain.  Gastrointestinal: Negative for nausea,  vomiting and abdominal pain.  Genitourinary: Negative for dysuria.  Musculoskeletal: Negative for neck pain.  Skin: Negative for rash.  Neurological: Positive for weakness, numbness and headaches. Negative for seizures.    Allergies  Gabapentin; Lyrica; Solu-medrol; Topamax; and Latex  Home Medications   Current Outpatient Rx  Name  Route  Sig  Dispense  Refill  . albuterol (PROVENTIL HFA;VENTOLIN HFA) 108 (90 BASE) MCG/ACT inhaler   Inhalation   Inhale 2 puffs into the lungs every 6 (six) hours as needed for wheezing or shortness of breath.         . ALPRAZolam (XANAX) 1 MG tablet   Oral   Take 1 mg by mouth 3 (three) times daily as needed for anxiety.         . butalbital-acetaminophen-caffeine (FIORICET, ESGIC) 50-325-40 MG per tablet   Oral   Take 1 tablet by mouth 2 (two) times daily as needed for headache.   30 tablet   1   . nitrofurantoin, macrocrystal-monohydrate, (MACROBID) 100 MG capsule   Oral   Take 100 mg by mouth 2 (two) times daily.         . QUEtiapine (SEROQUEL) 300 MG tablet   Oral   Take 300 mg by mouth at bedtime.         Marland Kitchen tiZANidine (ZANAFLEX) 4 MG tablet   Oral   Take 4  mg by mouth every 6 (six) hours as needed (for muscle spasms).          BP 125/81  Pulse 113  Temp(Src) 98.4 F (36.9 C) (Oral)  SpO2 99% Physical Exam  Nursing note and vitals reviewed. Constitutional: She is oriented to person, place, and time. She appears well-developed and well-nourished.  HENT:  Head: Normocephalic and atraumatic.  Eyes: EOM are normal. Pupils are equal, round, and reactive to light.  Neck: Neck supple.  No frank meningeal signs  Cardiovascular: Normal rate, regular rhythm and normal heart sounds.   No murmur heard. Pulmonary/Chest: Effort normal. No respiratory distress.  Abdominal: Soft. She exhibits no distension. There is tenderness. There is no rebound and no guarding.  LUQ tenderness  Musculoskeletal:  Cerebellar exam is normal  (finger to nose) Sensory exam normal for bilateral upper and lower extremities - and patient is able to discriminate between sharp and dull. Motor exam is 4+/5    Neurological: She is alert and oriented to person, place, and time.  Skin: Skin is warm and dry.    ED Course  Procedures (including critical care time) Labs Review Labs Reviewed  COMPREHENSIVE METABOLIC PANEL  CBC WITH DIFFERENTIAL  URINALYSIS, ROUTINE W REFLEX MICROSCOPIC   Imaging Review No results found.  EKG Interpretation   None       MDM  No diagnosis found.  Pt comes in with cc of headache x 1 month, fatigue, myalgias. She denies any camping trips, rashes, hx of tick bite. PCP had ordered LYME titers, which came back +. I am assuming pcp sent her to the ER with concerns for possible Lyme meningitis.  Pt has no frank meningeal signs, moves neck freely  - but she does indicate some neck "pullin" with her turning the head, and also facial numbness when she wakes up.  Will call ID to see if she needs a LP for these headaches, as i am underwhelmed (h/a x 1 month), but at the same time feel it is better to run this by ID.  10:40 PM ID hasnt called back. I have now watched patient in the ED for several hours.  WE will send her home with Doxy, and pcp follow up. Even if there was meningitis, it would be mild - and so tx would be doxy. Pt to see PCP in 14 days. Return precautions discussed. NO LYME CARDITIS.   Derwood Kaplan, MD 07/17/13 2241  EKG Interpretation     Ventricular Rate:  92 PR Interval:  142 QRS Duration: 81 QT Interval:  347 QTC Calculation: 430 R Axis:   67 Text Interpretation:  Sinus rhythm             Derwood Kaplan, MD 07/17/13 2258

## 2013-11-12 ENCOUNTER — Encounter (HOSPITAL_COMMUNITY): Payer: Self-pay | Admitting: Emergency Medicine

## 2013-11-12 ENCOUNTER — Emergency Department (HOSPITAL_COMMUNITY)
Admission: EM | Admit: 2013-11-12 | Discharge: 2013-11-12 | Disposition: A | Discharge status: Home / Self Care | Payer: Medicaid Other | Attending: Emergency Medicine | Admitting: Emergency Medicine

## 2013-11-12 DIAGNOSIS — M549 Dorsalgia, unspecified: Secondary | ICD-10-CM | POA: Insufficient documentation

## 2013-11-12 DIAGNOSIS — R52 Pain, unspecified: Secondary | ICD-10-CM

## 2013-11-12 DIAGNOSIS — G43909 Migraine, unspecified, not intractable, without status migrainosus: Secondary | ICD-10-CM | POA: Insufficient documentation

## 2013-11-12 DIAGNOSIS — J029 Acute pharyngitis, unspecified: Secondary | ICD-10-CM | POA: Insufficient documentation

## 2013-11-12 DIAGNOSIS — F3189 Other bipolar disorder: Secondary | ICD-10-CM | POA: Insufficient documentation

## 2013-11-12 DIAGNOSIS — Z79899 Other long term (current) drug therapy: Secondary | ICD-10-CM | POA: Insufficient documentation

## 2013-11-12 DIAGNOSIS — F17200 Nicotine dependence, unspecified, uncomplicated: Secondary | ICD-10-CM | POA: Insufficient documentation

## 2013-11-12 DIAGNOSIS — G8929 Other chronic pain: Secondary | ICD-10-CM | POA: Insufficient documentation

## 2013-11-12 DIAGNOSIS — Z91040 Latex allergy status: Secondary | ICD-10-CM | POA: Insufficient documentation

## 2013-11-12 DIAGNOSIS — I10 Essential (primary) hypertension: Secondary | ICD-10-CM | POA: Insufficient documentation

## 2013-11-12 DIAGNOSIS — F411 Generalized anxiety disorder: Secondary | ICD-10-CM | POA: Insufficient documentation

## 2013-11-12 DIAGNOSIS — Z8619 Personal history of other infectious and parasitic diseases: Secondary | ICD-10-CM | POA: Insufficient documentation

## 2013-11-12 MED ORDER — METOCLOPRAMIDE HCL 5 MG/ML IJ SOLN
10.0000 mg | Freq: Once | INTRAMUSCULAR | Status: AC
  Administered 2013-11-12: 10 mg via INTRAVENOUS
  Filled 2013-11-12: qty 2

## 2013-11-12 MED ORDER — ONDANSETRON 8 MG PO TBDP: 8.0000 mg | ORAL_TABLET | Freq: Three times a day (TID) | ORAL | Status: DC | PRN

## 2013-11-12 MED ORDER — KETOROLAC TROMETHAMINE 30 MG/ML IJ SOLN
30.0000 mg | Freq: Once | INTRAMUSCULAR | Status: AC
  Administered 2013-11-12: 30 mg via INTRAVENOUS
  Filled 2013-11-12: qty 1

## 2013-11-12 MED ORDER — SODIUM CHLORIDE 0.9 % IV BOLUS (SEPSIS)
1000.0000 mL | Freq: Once | INTRAVENOUS | Status: AC
  Administered 2013-11-12: 1000 mL via INTRAVENOUS

## 2013-11-12 MED ORDER — DIPHENHYDRAMINE HCL 50 MG/ML IJ SOLN
25.0000 mg | Freq: Once | INTRAMUSCULAR | Status: AC
  Administered 2013-11-12: 25 mg via INTRAVENOUS
  Filled 2013-11-12: qty 1

## 2013-11-12 NOTE — ED Provider Notes (Signed)
CSN: [id number]     Arrival date & time 11/12/13  1934 History   First MD Initiated Contact with Patient 11/12/13 1954     Chief Complaint  Patient presents with  . Migraine  . Sore Throat     (Consider location/radiation/quality/duration/timing/severity/associated sxs/prior Treatment) HPI Rita Lewis is a 28 y.o. female presents to emergency department complaining of typical for her migraine headaches, fibromyalgia pain, back pain. Patient states that her fibromyalgia pain and back pain of chronic, and worsened in the last few days, which she thinks is due to cold weather. She states migraine started yesterday. Pain is in the back of her head. Does not radiate. Denies any aura. Denies any visual changes. Denies any numbness or weakness in extremities. Admits to nausea and vomiting, states she's unable to keep anything down, did not take her medications today. She states she normally takes oxycodone 5 mg for her pain at home, and Xanax for her anxiety has not taken either one of those today. She states she called her doctor today but was unable to see them because of the snow day. States the office was closed. Patient denies any fever, chills. She denies any chest pain or shortness of breath. She denies a cough. No urinary symptoms. No change in her bowels.  Past Medical History  Diagnosis Date  . Migraines   . Hypertension   . Anxiety   . Bipolar 2 disorder   . Borderline personality disorder   . Manic depression   . Psychosis   . Fibromyalgia   . Cytomegalovirus (CMV) viremia    Past Surgical History  Procedure Laterality Date  . Tubal ligation    . Abdominal hysterectomy     Family History  Problem Relation Age of Onset  . Hypertension Other   . Diabetes Other   . Stroke Other    History  Substance Use Topics  . Smoking status: Current Every Day Smoker -- 1.00 packs/day    Types: Cigarettes  . Smokeless tobacco: Not on file  . Alcohol Use: No   OB History   Grav Para  Term Preterm Abortions TAB SAB Ect Mult Living                 Review of Systems  Constitutional: Negative for fever and chills.  HENT: Positive for congestion. Negative for sore throat.   Eyes: Positive for photophobia. Negative for pain, redness and visual disturbance.  Respiratory: Negative for cough, chest tightness and shortness of breath.   Cardiovascular: Negative for chest pain, palpitations and leg swelling.  Gastrointestinal: Positive for nausea and vomiting. Negative for abdominal pain and diarrhea.  Genitourinary: Negative for dysuria, flank pain and pelvic pain.  Musculoskeletal: Positive for arthralgias, back pain and myalgias. Negative for neck pain and neck stiffness.  Skin: Negative for rash.  Neurological: Positive for headaches. Negative for dizziness, weakness and light-headedness.  All other systems reviewed and are negative.      Allergies  Gabapentin; Lyrica; Solu-medrol; Topamax; and Latex  Home Medications   Current Outpatient Rx  Name  Route  Sig  Dispense  Refill  . albuterol (PROVENTIL HFA;VENTOLIN HFA) 108 (90 BASE) MCG/ACT inhaler   Inhalation   Inhale 2 puffs into the lungs every 6 (six) hours as needed for wheezing or shortness of breath.         . ALPRAZolam (XANAX) 1 MG tablet   Oral   Take 1 mg by mouth 3 (three) times daily as needed for anxiety.         .Marland Kitchen  diphenhydrAMINE (BENADRYL) 25 mg capsule   Oral   Take 25 mg by mouth every 6 (six) hours as needed (allergy).         Marland Kitchen oxyCODONE-acetaminophen (ROXICET) 5-325 MG/5ML solution   Oral   Take 5 mLs by mouth every 4 (four) hours as needed for severe pain (pain).         . propranolol (INDERAL) 10 MG tablet   Oral   Take 10 mg by mouth 2 (two) times daily.         . QUEtiapine (SEROQUEL) 300 MG tablet   Oral   Take 300 mg by mouth at bedtime.         Marland Kitchen tiZANidine (ZANAFLEX) 4 MG tablet   Oral   Take 4 mg by mouth every 6 (six) hours as needed (for muscle spasms).           BP 146/90  Pulse 109  Temp(Src) 98.5 F (36.9 C) (Oral)  Resp 20  Ht 5\' 6"  (1.676 m)  Wt 193 lb (87.544 kg)  BMI 31.17 kg/m2  SpO2 98% Physical Exam  Nursing note and vitals reviewed. Constitutional: She is oriented to person, place, and time. She appears well-developed and well-nourished. No distress.  HENT:  Head: Normocephalic and atraumatic.  Right Ear: External ear normal.  Left Ear: External ear normal.  Nose: Nose normal.  Mouth/Throat: Oropharynx is clear and moist. No oropharyngeal exudate.  Eyes: Conjunctivae are normal. Pupils are equal, round, and reactive to light.  Neck: Normal range of motion. Neck supple.  Cardiovascular: Normal rate, regular rhythm and normal heart sounds.   Pulmonary/Chest: Effort normal and breath sounds normal. No respiratory distress. She has no wheezes. She has no rales.  Abdominal: Soft. Bowel sounds are normal. She exhibits no distension. There is no tenderness. There is no rebound.  Musculoskeletal: She exhibits no edema.  Neurological: She is alert and oriented to person, place, and time. No cranial nerve deficit. Coordination normal.  5/5 and equal upper and lower extremity strength bilaterally. Equal grip strength bilaterally. Normal finger to nose and heel to shin. No pronator drift.   Skin: Skin is warm and dry.  Psychiatric: She has a normal mood and affect. Her behavior is normal.    ED Course  Procedures (including critical care time) Labs Review Labs Reviewed - No data to display Imaging Review No results found.  EKG Interpretation   None       MDM   Final diagnoses:  Migraine headache  Generalized pain     Patient is here generalized pain, states this is her fibromyalgia flaring up. Also with migraine that began yesterday. She was unable to take any medications for her symptoms today because she's had nausea vomiting to 2 migraine. I have offered patient IM medications however she states the only thing  that works for her is IV. IV placed, order Toradol, Benadryl, Reglan. Will hydrate. Her neurological exam is normal, I do not think she needs any lab work or imaging. Her headache is typical for her. She has been seen here multiple times for the same.   Pt feeling much better after reglan, benadryl, toradol and 1L of IV fluids. She is tolerating ginger ale in ED. Home with zofran. She as medications at home for pain and migraine. Follow up with pcp.   Filed Vitals:   11/12/13 1940 11/12/13 2220  BP: 146/90 106/68  Pulse: 109 76  Temp: 98.5 F (36.9 C)   TempSrc: Oral  Resp: 20 15  Height: 5\' 6"  (1.676 m)   Weight: 193 lb (87.544 kg)   SpO2: 98% 99%      Rita Rief A Briaunna Grindstaff, PA-C 11/12/13 2300

## 2013-11-12 NOTE — ED Provider Notes (Signed)
Medical screening examination/treatment/procedure(s) were performed by non-physician practitioner and as supervising physician I was immediately available for consultation/collaboration.  EKG Interpretation   None         Rolan BuccoMelanie Anysa Tacey, MD 11/12/13 [phone]

## 2013-11-12 NOTE — ED Notes (Signed)
Pt is c/o migraine headache, sore throat, body aches, vomiting  Pt states she called her dr and was told to come here for IVF and medication for pain and nausea

## 2013-11-12 NOTE — Discharge Instructions (Signed)
Take regular medications as prescribed by your doctor. zofran for nausea as prescribed. Follow up with primary care doctor.   Migraine Headache A migraine headache is an intense, throbbing pain on one or both sides of your head. A migraine can last for 30 minutes to several hours. CAUSES  The exact cause of a migraine headache is not always known. However, a migraine may be caused when nerves in the brain become irritated and release chemicals that cause inflammation. This causes pain. Certain things may also trigger migraines, such as:  Alcohol.  Smoking.  Stress.  Menstruation.  Aged cheeses.  Foods or drinks that contain nitrates, glutamate, aspartame, or tyramine.  Lack of sleep.  Chocolate.  Caffeine.  Hunger.  Physical exertion.  Fatigue.  Medicines used to treat chest pain (nitroglycerine), birth control pills, estrogen, and some blood pressure medicines. SIGNS AND SYMPTOMS  Pain on one or both sides of your head.  Pulsating or throbbing pain.  Severe pain that prevents daily activities.  Pain that is aggravated by any physical activity.  Nausea, vomiting, or both.  Dizziness.  Pain with exposure to bright lights, loud noises, or activity.  General sensitivity to bright lights, loud noises, or smells. Before you get a migraine, you may get warning signs that a migraine is coming (aura). An aura may include:  Seeing flashing lights.  Seeing bright spots, halos, or zig-zag lines.  Having tunnel vision or blurred vision.  Having feelings of numbness or tingling.  Having trouble talking.  Having muscle weakness. DIAGNOSIS  A migraine headache is often diagnosed based on:  Symptoms.  Physical exam.  A CT scan or MRI of your head. These imaging tests cannot diagnose migraines, but they can help rule out other causes of headaches. TREATMENT Medicines may be given for pain and nausea. Medicines can also be given to help prevent recurrent  migraines.  HOME CARE INSTRUCTIONS  Only take over-the-counter or prescription medicines for pain or discomfort as directed by your health care provider. The use of long-term narcotics is not recommended.  Lie down in a dark, quiet room when you have a migraine.  Keep a journal to find out what may trigger your migraine headaches. For example, write down:  What you eat and drink.  How much sleep you get.  Any change to your diet or medicines.  Limit alcohol consumption.  Quit smoking if you smoke.  Get 7 9 hours of sleep, or as recommended by your health care provider.  Limit stress.  Keep lights dim if bright lights bother you and make your migraines worse. SEEK IMMEDIATE MEDICAL CARE IF:   Your migraine becomes severe.  You have a fever.  You have a stiff neck.  You have vision loss.  You have muscular weakness or loss of muscle control.  You start losing your balance or have trouble walking.  You feel faint or pass out.  You have severe symptoms that are different from your first symptoms. MAKE SURE YOU:   Understand these instructions.  Will watch your condition.  Will get help right away if you are not doing well or get worse. Document Released: 09/12/2005 Document Revised: 07/03/2013 Document Reviewed: 05/20/2013 [facility] Patient Information 2014 [place], [place].

## 2014-02-08 ENCOUNTER — Emergency Department (HOSPITAL_COMMUNITY): Payer: Medicaid Other

## 2014-02-08 ENCOUNTER — Emergency Department (HOSPITAL_COMMUNITY)
Admission: EM | Admit: 2014-02-08 | Discharge: 2014-02-08 | Disposition: A | Discharge status: Home / Self Care | Payer: Medicaid Other | Attending: Emergency Medicine | Admitting: Emergency Medicine

## 2014-02-08 ENCOUNTER — Encounter (HOSPITAL_COMMUNITY): Payer: Self-pay | Admitting: Emergency Medicine

## 2014-02-08 DIAGNOSIS — Z8619 Personal history of other infectious and parasitic diseases: Secondary | ICD-10-CM | POA: Insufficient documentation

## 2014-02-08 DIAGNOSIS — G8929 Other chronic pain: Secondary | ICD-10-CM | POA: Insufficient documentation

## 2014-02-08 DIAGNOSIS — M549 Dorsalgia, unspecified: Secondary | ICD-10-CM

## 2014-02-08 DIAGNOSIS — F17200 Nicotine dependence, unspecified, uncomplicated: Secondary | ICD-10-CM | POA: Insufficient documentation

## 2014-02-08 DIAGNOSIS — Y939 Activity, unspecified: Secondary | ICD-10-CM | POA: Insufficient documentation

## 2014-02-08 DIAGNOSIS — F411 Generalized anxiety disorder: Secondary | ICD-10-CM | POA: Insufficient documentation

## 2014-02-08 DIAGNOSIS — F319 Bipolar disorder, unspecified: Secondary | ICD-10-CM | POA: Insufficient documentation

## 2014-02-08 DIAGNOSIS — W19XXXA Unspecified fall, initial encounter: Secondary | ICD-10-CM | POA: Insufficient documentation

## 2014-02-08 DIAGNOSIS — Z79899 Other long term (current) drug therapy: Secondary | ICD-10-CM | POA: Insufficient documentation

## 2014-02-08 DIAGNOSIS — IMO0002 Reserved for concepts with insufficient information to code with codable children: Secondary | ICD-10-CM | POA: Insufficient documentation

## 2014-02-08 DIAGNOSIS — I10 Essential (primary) hypertension: Secondary | ICD-10-CM | POA: Insufficient documentation

## 2014-02-08 DIAGNOSIS — Z91040 Latex allergy status: Secondary | ICD-10-CM | POA: Insufficient documentation

## 2014-02-08 DIAGNOSIS — Y929 Unspecified place or not applicable: Secondary | ICD-10-CM | POA: Insufficient documentation

## 2014-02-08 DIAGNOSIS — G43909 Migraine, unspecified, not intractable, without status migrainosus: Secondary | ICD-10-CM | POA: Insufficient documentation

## 2014-02-08 MED ORDER — HYDROMORPHONE HCL PF 1 MG/ML IJ SOLN
1.0000 mg | Freq: Once | INTRAMUSCULAR | Status: AC
  Administered 2014-02-08: 1 mg via INTRAVENOUS
  Filled 2014-02-08: qty 1

## 2014-02-08 MED ORDER — PROCHLORPERAZINE EDISYLATE 5 MG/ML IJ SOLN
10.0000 mg | Freq: Once | INTRAMUSCULAR | Status: AC
  Administered 2014-02-08: 10 mg via INTRAVENOUS
  Filled 2014-02-08: qty 2

## 2014-02-08 MED ORDER — DIPHENHYDRAMINE HCL 50 MG/ML IJ SOLN
25.0000 mg | Freq: Once | INTRAMUSCULAR | Status: AC
  Administered 2014-02-08: 25 mg via INTRAVENOUS
  Filled 2014-02-08: qty 1

## 2014-02-08 MED ORDER — SODIUM CHLORIDE 0.9 % IV BOLUS (SEPSIS)
1000.0000 mL | Freq: Once | INTRAVENOUS | Status: AC
Start: 2014-02-08 — End: 2014-02-08
  Administered 2014-02-08: 1000 mL via INTRAVENOUS

## 2014-02-08 NOTE — ED Notes (Signed)
Pt c/o migraine since yesterday.  States that she hasn't been able to sleep.  C/o vomiting, sensitivity to light/sound.  Also c/o "pain from waist down".  States pain starts in her hips and radiates down both legs.

## 2014-02-08 NOTE — ED Provider Notes (Signed)
CSN: [id number]     Arrival date & time 02/08/14  [id number] History   First MD Initiated Contact with Patient 02/08/14 [phone]     Chief Complaint  Patient presents with  . Migraine  . Leg Pain    Patient is a 28 y.o. female presenting with migraines. The history is provided by the patient.  Migraine This is a recurrent problem. The current episode started yesterday. The problem has been gradually worsening. Pertinent negatives include no abdominal pain and no shortness of breath. Exacerbated by: light , loud noises. Nothing (She tried her prescription meds without relief.) relieves the symptoms.   the patient also has been having trouble with pain in her lower back that has now radiated down both of her legs. The pain goes from her waist down. A fall today. The symptoms were exacerbated. The patient denies any trouble with incontinence. She denies any numbness. The patient does have history of chronic back trouble. In the past she was seeing a pain management Dr. Quincy Carneshey recommended a procedure however the patient was scared to have that done. Patient also has a history of bipolar disorder. She has been taking her medications except for her cervical. She states she has not been sleeping well recently. She denies any suicidal or homicidal ideation  Past Medical History  Diagnosis Date  . Migraines   . Hypertension   . Anxiety   . Bipolar 2 disorder   . Borderline personality disorder   . Manic depression   . Psychosis   . Fibromyalgia   . Cytomegalovirus (CMV) viremia    Past Surgical History  Procedure Laterality Date  . Tubal ligation    . Abdominal hysterectomy     Family History  Problem Relation Age of Onset  . Hypertension Other   . Diabetes Other   . Stroke Other    History  Substance Use Topics  . Smoking status: Current Every Day Smoker -- 1.00 packs/day    Types: Cigarettes  . Smokeless tobacco: Not on file  . Alcohol Use: No   OB History   Grav Para Term Preterm Abortions  TAB SAB Ect Mult Living                 Review of Systems  Constitutional: Negative for fever.  HENT: Negative for voice change.   Respiratory: Negative for shortness of breath.   Cardiovascular: Negative for palpitations.  Gastrointestinal: Negative for abdominal pain.  Genitourinary: Negative for difficulty urinating.       No incontinence  Musculoskeletal: Negative for joint swelling and neck pain.  Skin: Negative for color change.  Neurological: Negative for weakness and numbness (no paresthesias).       No muscle weakness  Psychiatric/Behavioral: Negative for confusion.  All other systems reviewed and are negative.     Allergies  Gabapentin; Lyrica; Solu-medrol; Topamax; and Latex  Home Medications   Prior to Admission medications   Medication Sig Start Date End Date Taking? Authorizing Provider  ALPRAZolam Prudy Feeler(XANAX) 1 MG tablet Take 1 mg by mouth 3 (three) times daily as needed for anxiety.   Yes Historical Provider, MD  eletriptan (RELPAX) 40 MG tablet Take 40 mg by mouth as needed for migraine or headache. One tablet by mouth at onset of headache. May repeat in 2 hours if headache persists or recurs.   Yes Historical Provider, MD  ondansetron (ZOFRAN ODT) 8 MG disintegrating tablet Take 1 tablet (8 mg total) by mouth every 8 (eight) hours as needed for  nausea or vomiting. 11/12/13  Yes Tatyana A Kirichenko, PA-C  propranolol (INDERAL) 10 MG tablet Take 10 mg by mouth 2 (two) times daily.   Yes Historical Provider, MD  QUEtiapine (SEROQUEL) 300 MG tablet Take 600 mg by mouth at bedtime.    Yes Historical Provider, MD  tiZANidine (ZANAFLEX) 4 MG tablet Take 4 mg by mouth every 6 (six) hours as needed (for muscle spasms).   Yes Historical Provider, MD   BP 107/50  Pulse 84  Temp(Src) 98.3 F (36.8 C) (Oral)  Resp 16  SpO2 99% Physical Exam  Nursing note and vitals reviewed. Constitutional: She appears well-developed and well-nourished. No distress.  HENT:  Head:  Normocephalic and atraumatic.  Right Ear: External ear normal.  Left Ear: External ear normal.  Nose: Nose normal.  Eyes: Conjunctivae and EOM are normal. Right eye exhibits no discharge. Left eye exhibits no discharge. No scleral icterus.  Neck: Neck supple. No tracheal deviation present.  Cardiovascular: Normal rate, regular rhythm and intact distal pulses.   Pulmonary/Chest: Effort normal and breath sounds normal. No stridor. No respiratory distress. She has no wheezes. She has no rales.  Abdominal: Soft. Bowel sounds are normal. She exhibits no distension. There is no tenderness. There is no rebound and no guarding.  Musculoskeletal: She exhibits no edema and no tenderness.       Lumbar back: She exhibits decreased range of motion, pain and spasm. She exhibits no swelling and no edema.  Neurological: She is alert. She has normal strength. She is not disoriented. No cranial nerve deficit (no facial droop, extraocular movements intact, no slurred speech) or sensory deficit. She exhibits normal muscle tone. She displays no seizure activity. Coordination normal.  Reflex Scores:      Patellar reflexes are 2+ on the right side and 2+ on the left side.      Achilles reflexes are 2+ on the right side and 2+ on the left side. Skin: Skin is warm and dry. No rash noted. She is not diaphoretic. No erythema.  Psychiatric: She has a normal mood and affect. Her behavior is normal. Thought content normal.    ED Course  Procedures (including critical care time) Labs Review Labs Reviewed - No data to display  Imaging Review Dg Lumbar Spine Complete  02/08/2014   CLINICAL DATA:  Fall with leg pain.  EXAM: LUMBAR SPINE - COMPLETE 4+ VIEW  COMPARISON:  07/06/2013 abdominal CT.  FINDINGS: There is no evidence of lumbar spine fracture. Alignment is normal. Intervertebral disc spaces are maintained.  IMPRESSION: Negative.   Electronically Signed   By: Tiburcio PeaJonathan  Watts M.D.   On: 02/08/2014 10:25    MDM    Final diagnoses:  Migraine headache  Back pain    Back pain has been a chronic issue.  No injuries noted on xray from her fall.    Migraine improved with treatment.    Regarding insomnia, she should resume her seroquel.  Rec follow up with a PCP    Celene KrasJon R Matthew Pais, MD 02/08/14 [phone]

## 2014-02-08 NOTE — Discharge Instructions (Signed)
Back Pain, Adult Low back pain is very common. About 1 in 5 people have back pain.The cause of low back pain is rarely dangerous. The pain often gets better over time.About half of people with a sudden onset of back pain feel better in just 2 weeks. About 8 in 10 people feel better by 6 weeks.  CAUSES Some common causes of back pain include:  Strain of the muscles or ligaments supporting the spine.  Wear and tear (degeneration) of the spinal discs.  Arthritis.  Direct injury to the back. DIAGNOSIS Most of the time, the direct cause of low back pain is not known.However, back pain can be treated effectively even when the exact cause of the pain is unknown.Answering your caregiver's questions about your overall health and symptoms is one of the most accurate ways to make sure the cause of your pain is not dangerous. If your caregiver needs more information, he or she may order lab work or imaging tests (X-rays or MRIs).However, even if imaging tests show changes in your back, this usually does not require surgery. HOME CARE INSTRUCTIONS For many people, back pain returns.Since low back pain is rarely dangerous, it is often a condition that people can learn to [facility] their own.   Remain active. It is stressful on the back to sit or stand in one place. Do not sit, drive, or stand in one place for more than 30 minutes at a time. Take short walks on level surfaces as soon as pain allows.Try to increase the length of time you walk each day.  Do not stay in bed.Resting more than 1 or 2 days can delay your recovery.  Do not avoid exercise or work.Your body is made to move.It is not dangerous to be active, even though your back may hurt.Your back will likely heal faster if you return to being active before your pain is gone.  Pay attention to your body when you bend and lift. Many people have less discomfortwhen lifting if they bend their knees, keep the load close to their bodies,and  avoid twisting. Often, the most comfortable positions are those that put less stress on your recovering back.  Find a comfortable position to sleep. Use a firm mattress and lie on your side with your knees slightly bent. If you lie on your back, put a pillow under your knees.  Only take over-the-counter or prescription medicines as directed by your caregiver. Over-the-counter medicines to reduce pain and inflammation are often the most helpful.Your caregiver may prescribe muscle relaxant drugs.These medicines help dull your pain so you can more quickly return to your normal activities and healthy exercise.  Put ice on the injured area.  Put ice in a plastic bag.  Place a towel between your skin and the bag.  Leave the ice on for 15-20 minutes, 03-04 times a day for the first 2 to 3 days. After that, ice and heat may be alternated to reduce pain and spasms.  Ask your caregiver about trying back exercises and gentle massage. This may be of some benefit.  Avoid feeling anxious or stressed.Stress increases muscle tension and can worsen back pain.It is important to recognize when you are anxious or stressed and learn ways to manage it.Exercise is a great option. SEEK MEDICAL CARE IF:  You have pain that is not relieved with rest or medicine.  You have pain that does not improve in 1 week.  You have new symptoms.  You are generally not feeling well. SEEK  IMMEDIATE MEDICAL CARE IF:   You have pain that radiates from your back into your legs.  You develop new bowel or bladder control problems.  You have unusual weakness or numbness in your arms or legs.  You develop nausea or vomiting.  You develop abdominal pain.  You feel faint. Document Released: 09/12/2005 Document Revised: 03/13/2012 Document Reviewed: 01/31/2011 [facility] Patient Information 2014 [place], [place].   Emergency Department Resource Guide 1) Find a Doctor and Pay Out of Pocket Although you won't have to find  out who is covered by your insurance plan, it is a good idea to ask around and get recommendations. You will then need to call the office and see if the doctor you have chosen will accept you as a new patient and what types of options they offer for patients who are self-pay. Some doctors offer discounts or will set up payment plans for their patients who do not have insurance, but you will need to ask so you aren't surprised when you get to your appointment.  2) Contact Your Local Health Department Not all health departments have doctors that can see patients for sick visits, but many do, so it is worth a call to see if yours does. If you don't know where your local health department is, you can check in your phone book. The CDC also has a tool to help you locate your state's health department, and many state websites also have listings of all of their local health departments.  3) Find a Walk-in Clinic If your illness is not likely to be very severe or complicated, you may want to try a walk in clinic. These are popping up all over the country in pharmacies, drugstores, and shopping centers. They're usually staffed by nurse practitioners or physician assistants that have been trained to treat common illnesses and complaints. They're usually fairly quick and inexpensive. However, if you have serious medical issues or chronic medical problems, these are probably not your best option.  No Primary Care Doctor: - Call Health Connect at  [phone] - they can help you locate a primary care doctor that  accepts your insurance, provides certain services, etc. - Physician Referral Service- [phone]  Chronic Pain Problems: Organization         Address  Phone   Notes  [facility]  [phone] Patients need to be referred by their primary care doctor.   Medication Assistance: Organization         Address  Phone   Notes  [facility] [address] [place], [place] [address] [phone] --Must be a resident of [facility] -- Must have NO insurance coverage whatsoever (no Medicaid/ Medicare, etc.) -- The pt. MUST have a primary care doctor that directs their care regularly and follows them in the community   MedAssist  [phone]   [employer]  [phone]    Agencies that provide inexpensive medical care: Organization         Address  Phone   Notes  [facility]  [phone]) [phone]   [facility]    [phone]   [facility] [address] [place], [place] [address] [phone]   [facility] [address], [place] [phone]   [facility]anned Parenthood    [phone]) [phone]   [facility]    [phone]   [facility]  [address], [place] Phone:  [phone], Fax:  [phone] Hours of Operation:  9 am - 6 pm, M-F.  Also accepts Medicaid/Medicare and self-pay.  [facility]  [address], [place] Phone: [phone], Fax: [phone]. Hours of Operation:  8:30 am - 5:30 pm, M-F.  Also accepts Medicaid and self-pay.  [facility] [place] [address] [place] Phone: [phone]   [facility] [address] Natasha Bence [place], [place] [phone], Ext. [phone] Mondays & Thursdays: 7-9 AM.  First 15 patients are seen on a first come, first serve basis.    Medicaid-accepting [facility] Providers:  Organization         Address  Phone   Notes  [facility] [address], [place] [phone] Also accepts self-pay patients.  [facility] [address] Laurell Josephs [place], [place]  [phone]   [facility] [address], [place] [phone]   [facility] [address] [place] [phone]   Renaye Rakers  [address], [place]   [phone] Only accepts [place] Access [place] patients after they have their name applied to their card.   Self-Pay (no insurance) in [facility]:  Organization         Address  Phone   Notes  Sickle Cell Patients, [facility] [address] [place], [place] [phone]   [facility] [address] [place], [place] [phone]   [facility]  [address], [place] [phone]   [facility]/Dr. Osei-Bonsu  [address], [place] or [address], [place] [phone] Phone number for both [place] and [place] locations is the same.  [facility] [address], [place] [phone]   [facility] [address] [place] or [address] [phone]   [facility] [address] [place] [phone], phone; [phone], fax Sees patients 1st and 3rd Saturday of every month.  Must not qualify for public or private insurance (i.e. Medicaid, Medicare, [place] Health Choice, Veterans' Benefits)  Household income should be no more than 200% of the poverty level The clinic cannot treat you if you are pregnant or think you are pregnant  Sexually transmitted diseases are not treated at the clinic.   Dental Care: Organization         Address  Phone  Notes  [facility] [address] [place], [place] [phone] Accepts children up to age 48 who are enrolled in [place] or [place] Health Choice; pregnant women with a Medicaid card; and children who have applied for Medicaid or [place] Health Choice, but were declined, whose parents can pay a reduced fee at time of service.  [facility]  [address], [place] [phone] Accepts children up to age 42 who  are enrolled in [place] or [place] Health Choice; pregnant women with a Medicaid card; and children who have applied for Medicaid or  Health Choice, but were declined, whose parents can pay a reduced fee at time of service.  [facility] Adult Dental Access PROGRAM  [address] [place], [place] [phone] Patients  are seen by appointment only. Walk-ins are not accepted. [facility] will see patients 29 years of age and older. Monday - Tuesday (8am-5pm) Most Wednesdays (8:30-5pm) $30 per visit, cash only  [facility]  [address], [place] [facility] [place] [phone] Patients are seen by appointment only. Walk-ins are not accepted. [facility] will see patients 29 years of age and older. One Wednesday Evening (Monthly: Volunteer Based).  $30 per visit, cash only  [employer] [facility] [facility]  [phone] for adults; Children under age 24, call Graduate Pediatric Dentistry at [phone]. Children aged 484-14, please call [phone] to request a pediatric application.  Dental services are provided in all areas of dental care including fillings, crowns and bridges, complete and partial dentures, implants, gum treatment, root canals, and extractions. Preventive care is also provided. Treatment is provided to both adults and children. Patients are selected via a lottery and there is often a waiting list.   [facility] [address], [place]  [phone]) [phone] [email or web address]   [facility] [address], [place], [place] [phone], Ext. [phone] Second and Fourth Thursday of each month, opens at 6:30 AM; Clinic ends at 9 AM.  Patients are seen on a first-come first-served basis, and a limited number are seen during each clinic.   [facility]  [address], [place], [place] [phone]) [phone]   Eligibility Requirements You must have lived in [place], [place], or [place] for at least the last three months.   You  cannot be eligible for state or federal sponsored [place] Cityhealthcare insurance, including CIGNAVeterans Administration, [place]icaid, or [employer] EntertainmentMedicare.   You generally cannot be eligible for healthcare insurance through your employer.    How to apply: Eligibility screenings are held every Tuesday and Wednesday afternoon from 1:00 pm until 4:00 pm. You do not need an appointment for the interview!  [facility] [address], [place], [place] [phone]   [facility]  [phone]   [facility]  [phone]   [facility]  [phone]    Behavioral Health Resources in the Community: Intensive Outpatient Programs Organization         Address  Phone  Notes  [facility] [address], [place], [place] [phone]   [facility] Outpatient [address], [place], [place] [phone]   [facility] [address], [place], [place]  [phone]   [facility] [address],  [place], [place] [phone] or [phone]   Substance Abuse Resources Organization         Address  Phone  Notes  [facility]  [phone]   [facility]  [phone]   [facility]  [phone]   [facility]  [phone]   Residential & Outpatient Substance Abuse Program  [phone]   Psychological Services Organization         Address  Phone  Notes  [facility]  [phone]   [facility]  [phone]- [phone]   [facility] [address], [place] [phone] or [phone]    Mobile Crisis Teams Organization         Address  Phone  Notes  [facility], Mobile Crisis Care Unit  [phone]   [facility]  [address] [place], [place] [phone]   [facility] [address] [place]  [place] [phone]    Self-Help/Support  Groups Organization         Address  Phone             Notes  [facility] [place] - variety of support groups  [phone]- [phone] Call for more information  Narcotics Anonymous (NA), [facility] [address], [employer] [place]  2 meetings at this location   Statistician         Address  Phone  Notes  [facility] [address],    [place]  [phone]   [facility]  [address], [place] [address], [place], [place] [phone]   [facility] [address] [place], [place] [phone] Admissions: 8am-3pm M-F  [facility] [address]    [place], [place] [phone]   [facility] [address] [place], [place], [place] [phone]   [facility] [address]  [place], [place] [phone]   [facility] - Intensive Outpatient [address] Laurell Josephs [address], [place], [place] [phone]   [facility] ([facility] [address] [place].,  [place], [place] [phone] or [phone]   [facility] ([facility]) [address] [place], [place] [phone] Accepts Medicaid  [facility] [address]  [place] [phone] Substance Abuse/Addiction Treatment   [facility] Organization         Address  Phone  Notes  [facility]  [phone]   Angie Fava, PhD [address] Ervin Knack [place], [place]   [phone] or [phone]   [facility]   [address] [place], [place] [phone]   [facility] [address] [place], [place] [phone] Insurance/Medicaid/sponsorship through [facility] and Families [address]                                    [place], [place] [phone] Therapy/tele-psych/case  [facility] [address], [place] [phone]    Dr. Lolly Mustache  [phone]   [facility]  United [employer] [facility] Dept. 1) [address], [place] 2) [address] [place] 3)  [address], [place] [phone]  [phone]   [facility] Child Abuse Hotline [phone] or [phone] (After Hours)       Migraine Headache A migraine headache is an intense, throbbing pain on one or both sides of your head. A migraine can last for 30 minutes to several hours. CAUSES  The exact cause of a migraine headache is not always known. However, a migraine may be caused when nerves in the brain become irritated and release chemicals that cause inflammation. This causes pain. Certain things may also trigger migraines, such as:  Alcohol.  Smoking.  Stress.  Menstruation.  Aged cheeses.  Foods or drinks that contain nitrates, glutamate, aspartame, or tyramine.  Lack of sleep.  Chocolate.  Caffeine.  Hunger.  Physical exertion.  Fatigue.  Medicines used to treat chest pain (nitroglycerine), birth control pills, estrogen, and some blood pressure medicines. SIGNS AND SYMPTOMS  Pain on one or both sides of your head.  Pulsating or throbbing pain.  Severe pain that prevents daily activities.  Pain that is aggravated by any physical activity.  Nausea,  vomiting, or both.  Dizziness.  Pain with exposure to bright lights, loud noises, or activity.  General sensitivity to bright lights, loud noises, or smells. Before you get a migraine, you may get warning signs that a migraine is coming (aura). An aura may include:  Seeing flashing lights.  Seeing bright spots, halos, or zig-zag lines.  Having tunnel vision or blurred vision.  Having feelings of numbness or tingling.  Having trouble talking.  Having muscle weakness. DIAGNOSIS  A migraine headache is often diagnosed based on:  Symptoms.  Physical exam.  A CT scan or MRI of your head. These imaging tests cannot diagnose migraines, but they  can help rule out other causes of headaches. TREATMENT Medicines may be given for pain and nausea. Medicines can also be given to help prevent recurrent migraines.  HOME CARE INSTRUCTIONS  Only take over-the-counter or prescription medicines for pain or discomfort as directed by your health care provider. The use of long-term narcotics is not recommended.  Lie down in a dark, quiet room when you have a migraine.  Keep a journal to find out what may trigger your migraine headaches. For example, write down:  What you eat and drink.  How much sleep you get.  Any change to your diet or medicines.  Limit alcohol consumption.  Quit smoking if you smoke.  Get 7 9 hours of sleep, or as recommended by your health care provider.  Limit stress.  Keep lights dim if bright lights bother you and make your migraines worse. SEEK IMMEDIATE MEDICAL CARE IF:   Your migraine becomes severe.  You have a fever.  You have a stiff neck.  You have vision loss.  You have muscular weakness or loss of muscle control.  You start losing your balance or have trouble walking.  You feel faint or pass out.  You have severe symptoms that are different from your first symptoms. MAKE SURE YOU:   Understand these instructions.  Will watch your condition.  Will get help right away if you are not doing well or get worse. Document Released: 09/12/2005 Document Revised: 07/03/2013 Document Reviewed: 05/20/2013 [facility] Patient Information 2014 [place], [place].

## 2014-10-08 IMAGING — US US PELVIS COMPLETE
1 series · 13 of 25 positions shown · non-contrast
Comparison: CT 02/05/2013

CLINICAL DATA: Left ovarian pain, question torsion

EXAM:
TRANSABDOMINAL AND TRANSVAGINAL ULTRASOUND OF PELVIS
DOPPLER ULTRASOUND OF OVARIES
TECHNIQUE: Both transabdominal and transvaginal ultrasound examinations of the
pelvis were performed. Transabdominal technique was performed for
global imaging of the pelvis including uterus, ovaries, adnexal
regions, and pelvic cul-de-sac.
It was necessary to proceed with endovaginal exam following the
transabdominal exam to visualize the ovaries and adnexa. Color and
duplex Doppler ultrasound was utilized to evaluate blood flow to the
ovaries.

[Series 1: us pelvis complete · 0.24mm/px · 13 of 28 slices shown]
[im 1/28]
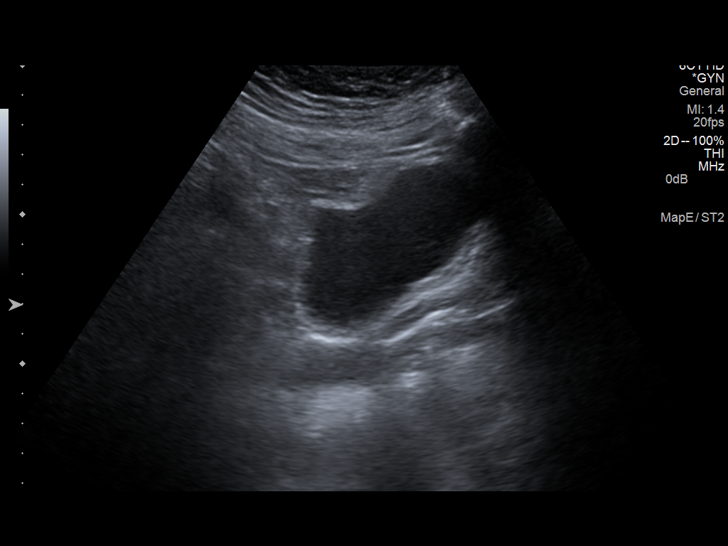
[im 3/28]
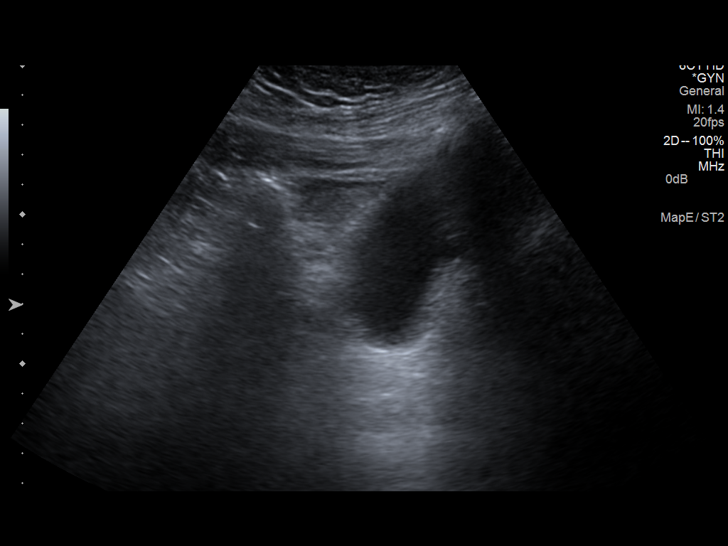
[im 5/28]
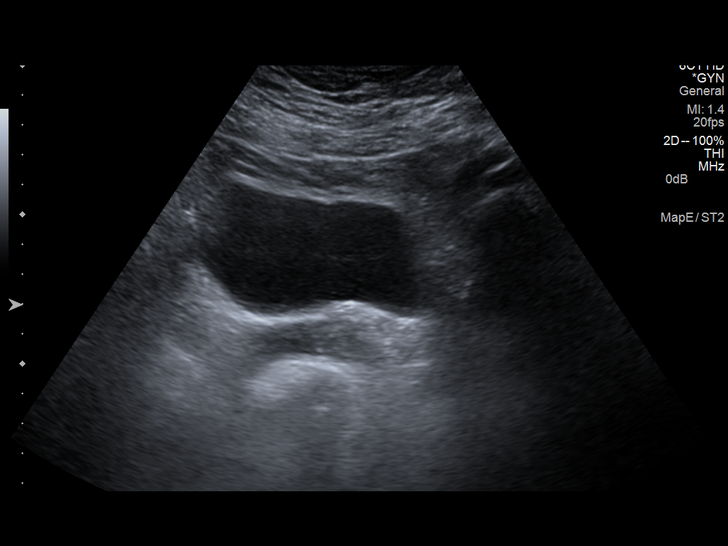
[im 7/28]
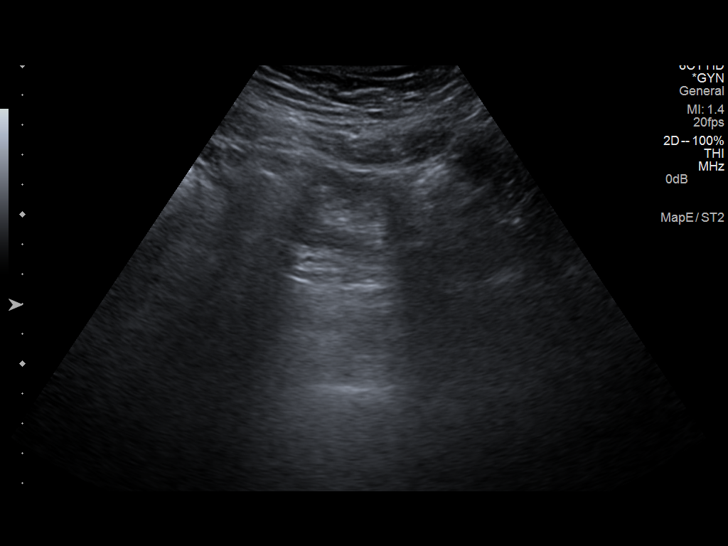
[im 10/28]
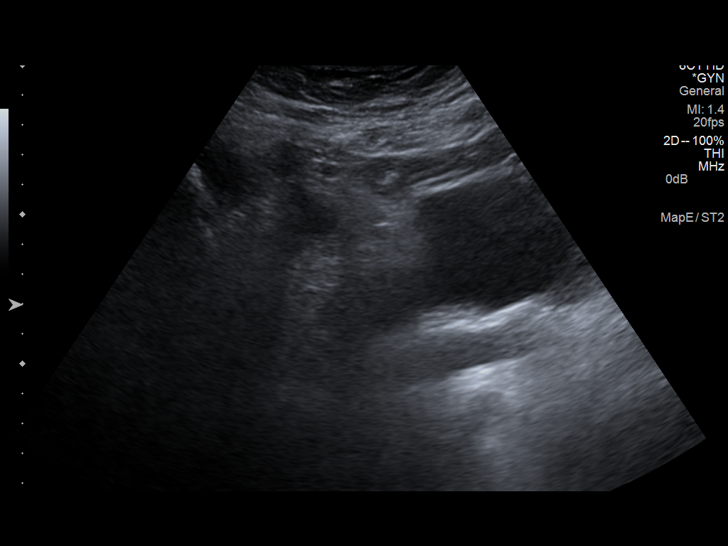
[im 12/28]
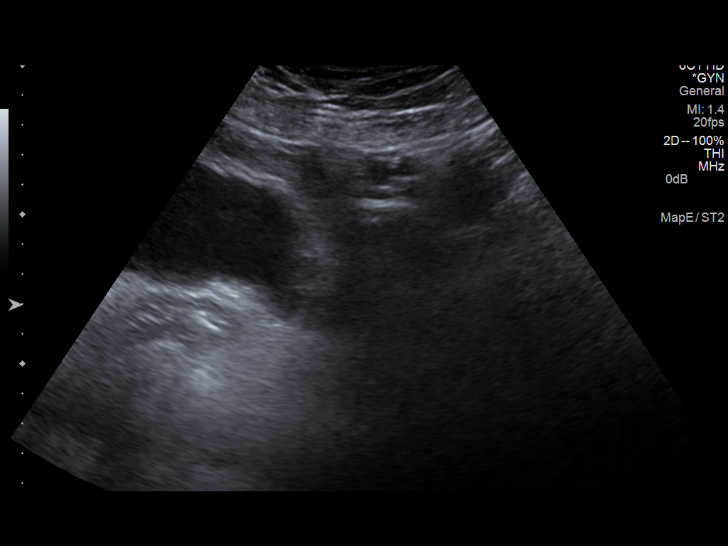
[im 14/28]
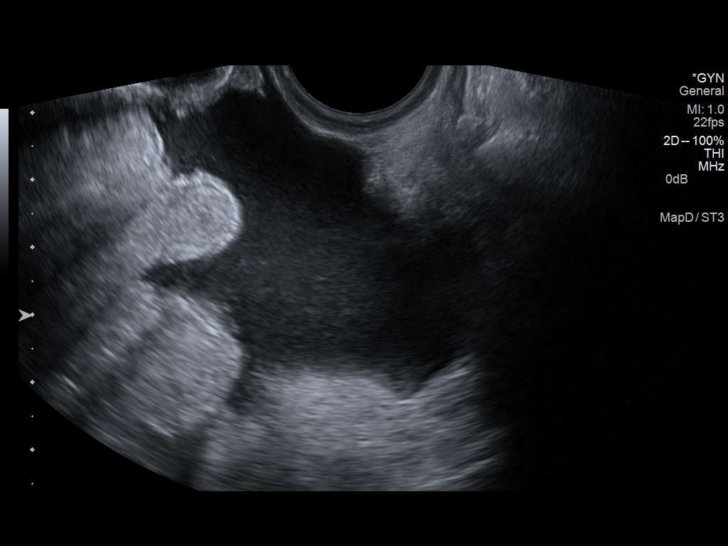
[im 16/28]
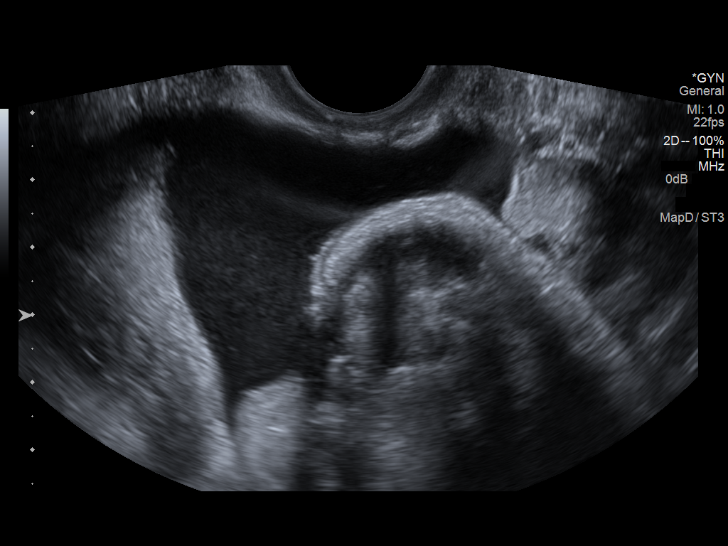
[im 19/28]
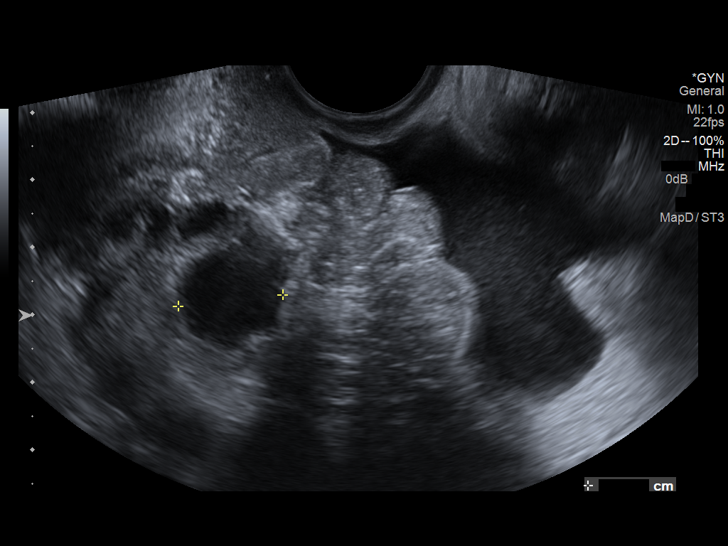
[im 21/28]
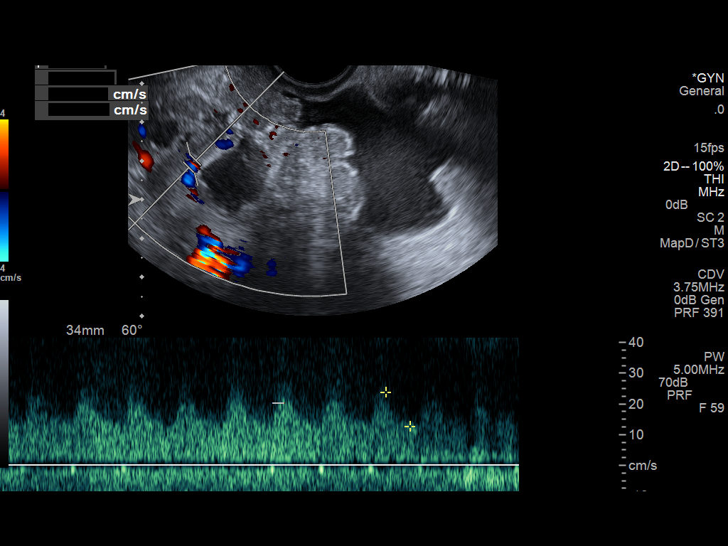
[im 23/28]
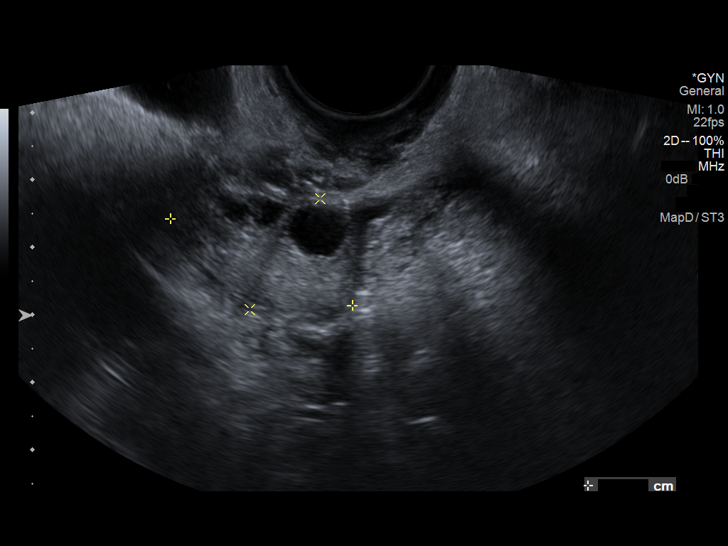
[im 25/28]
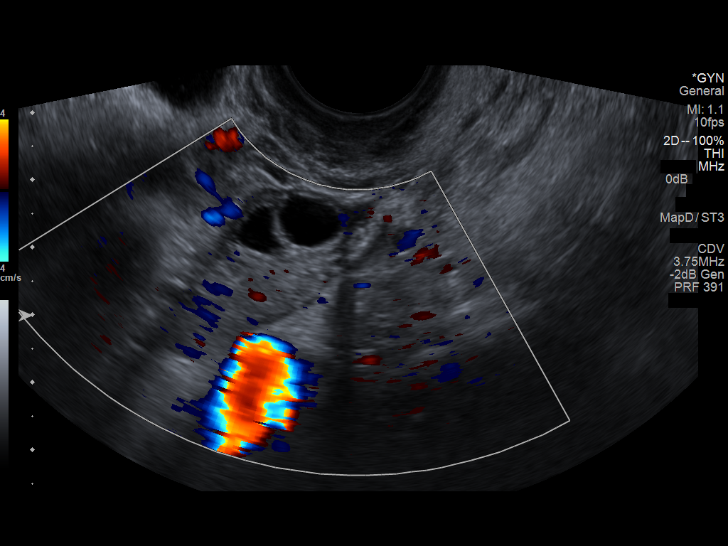
[im 28/28]
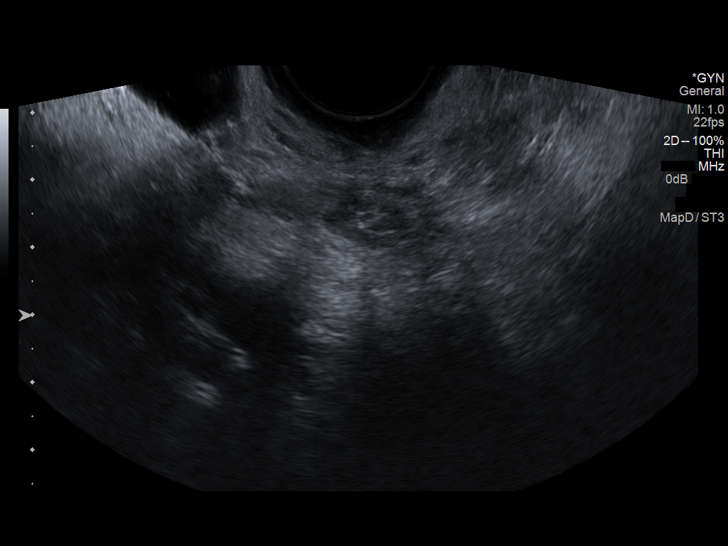

[13 of 25 positions shown; findings below may reference images not displayed]

FINDINGS: Uterus

Measurements: Prior hysterectomy. No fibroids or other mass
visualized.

Endometrium

Thickness: N/A  No focal abnormality visualized.

Right ovary

Measurements: 3.2 x 2.5 x 2.4 cm. Small follicles. Normal size and
echotexture. No adnexal masses.

Left ovary

Measurements: 3.0 x 2.0 x 1.5 cm. Multiple small follicles. Normal
size and echotexture. No adnexal masses.

Pulsed Doppler evaluation of both ovaries demonstrates normal
low-resistance arterial and venous waveforms.

Other findings

Moderate complex free fluid noted in the pelvis.
IMPRESSION: Moderate complex free fluid in the pelvis. While nonspecific, this
could be related to ruptured ovarian cyst. No evidence of ovarian
torsion. Prior hysterectomy.

No sonographic evidence for ovarian torsion.

## 2015-05-21 IMAGING — CR DG LUMBAR SPINE COMPLETE 4+V
5 series · 5 of 5 positions shown · non-contrast
Comparison: 07/06/2013 abdominal CT.

CLINICAL DATA: Fall with leg pain.

EXAM:
LUMBAR SPINE - COMPLETE 4+ VIEW

[t lumbar spine ap]
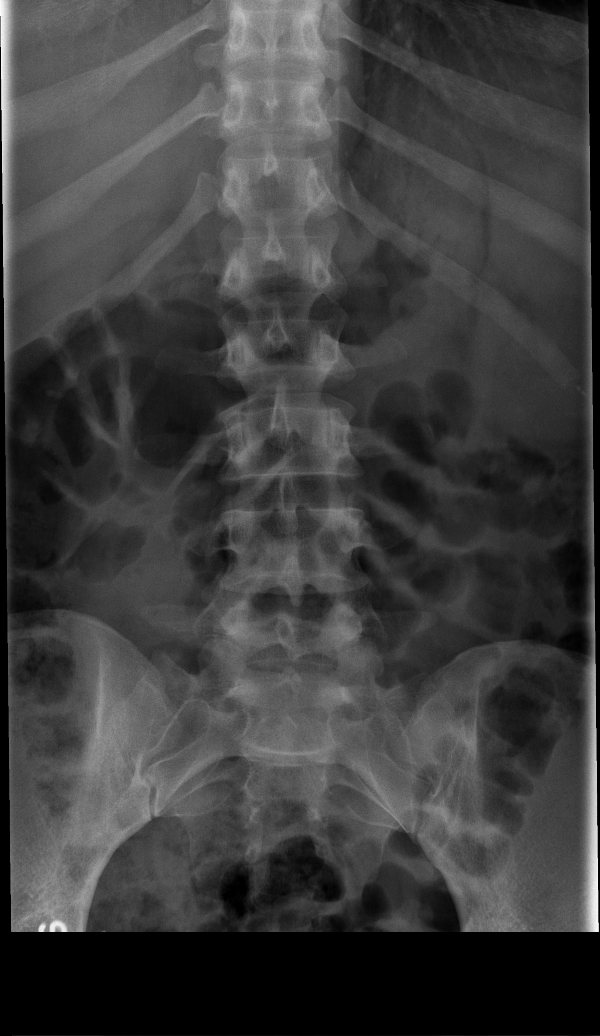

[t lumbar spine obl (1 of 2)]
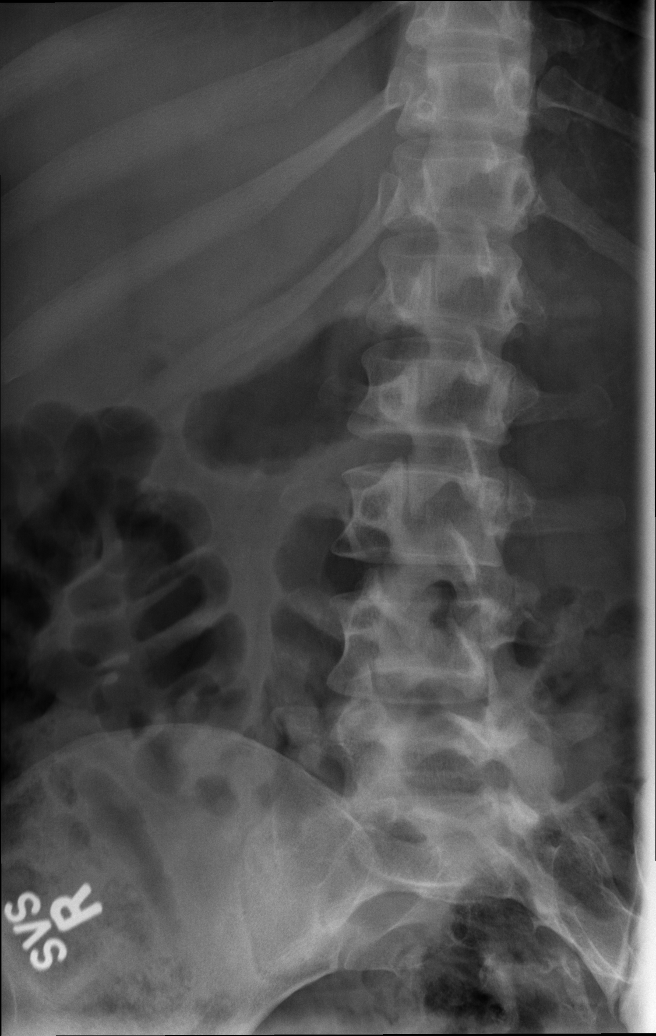

[t lumbar spine obl (2 of 2)]
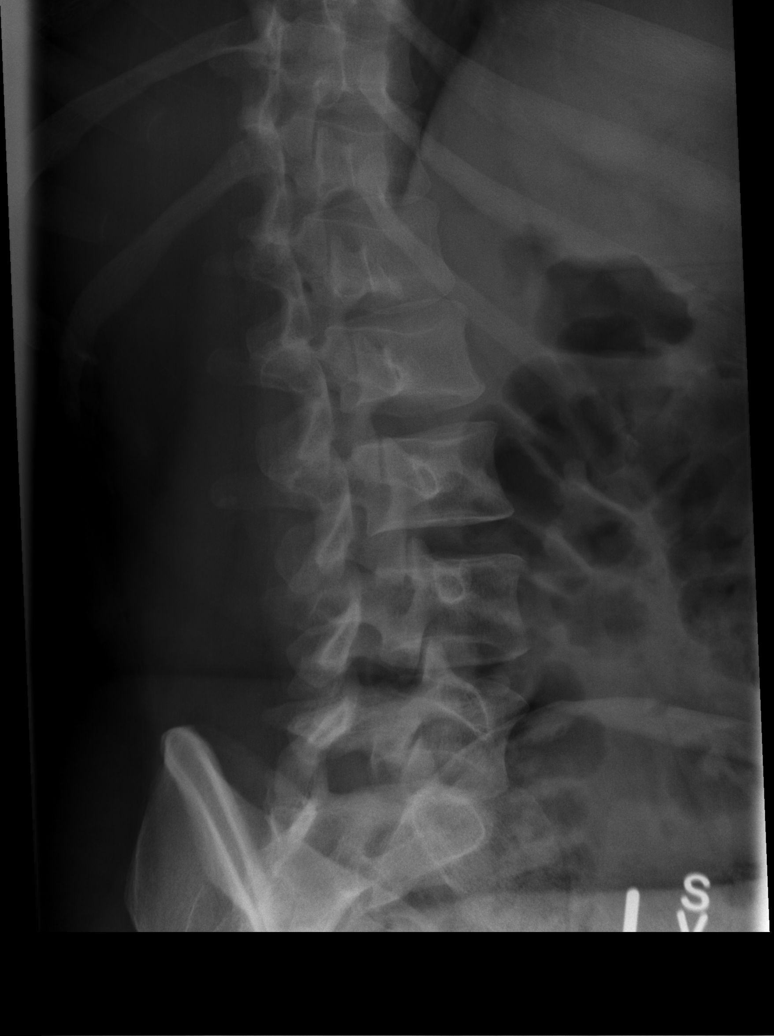

[t lumbar spine lat]
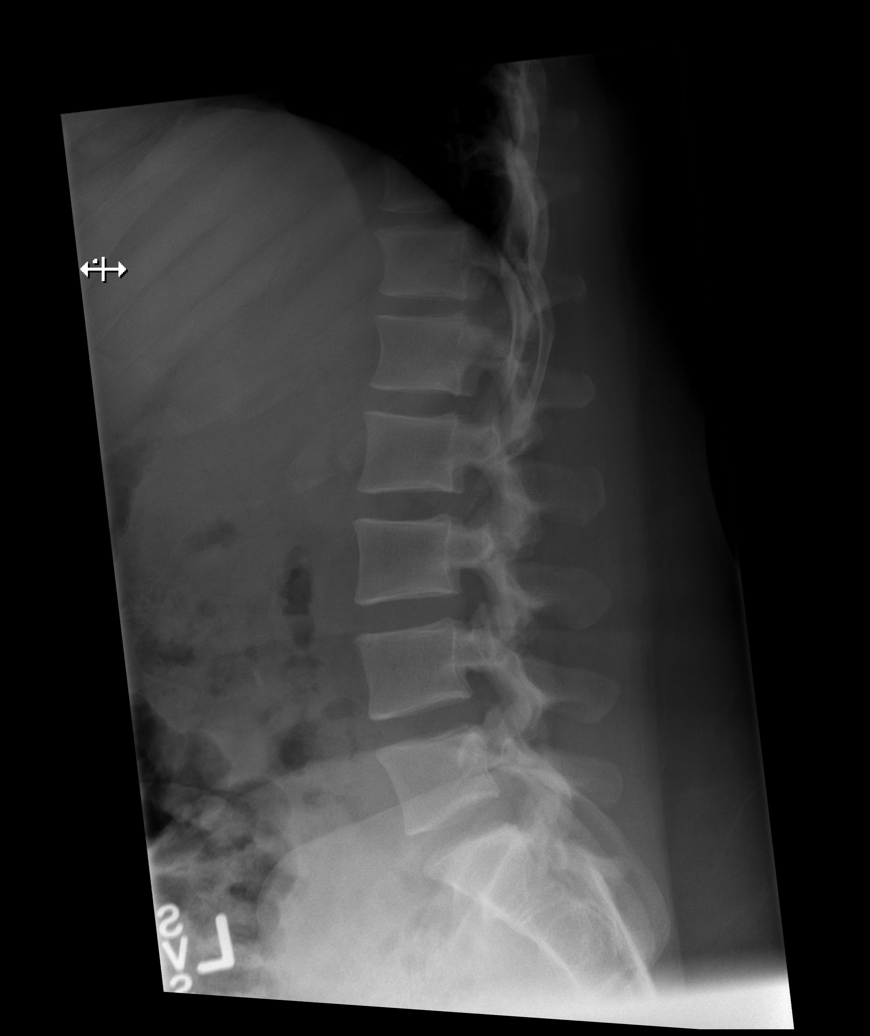

[t lumbar l-5 s-1 spot]
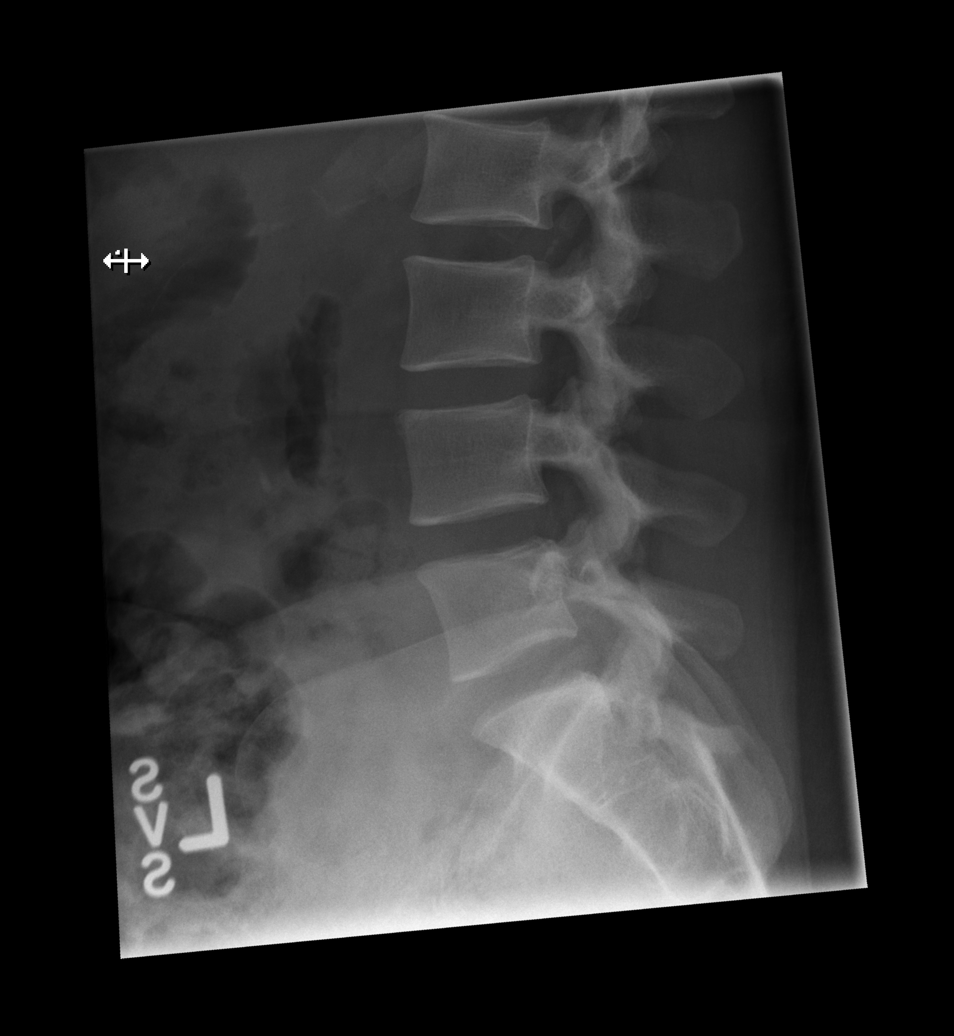

[5 of 5 positions shown; findings below may reference images not displayed]

FINDINGS: There is no evidence of lumbar spine fracture. Alignment is normal.
Intervertebral disc spaces are maintained.
IMPRESSION: Negative.

## 2017-01-12 ENCOUNTER — Inpatient Hospital Stay (HOSPITAL_COMMUNITY)
Admission: AD | Admit: 2017-01-12 | Discharge: 2017-01-18 | DRG: 885 | Disposition: A | Discharge status: Home / Self Care | Payer: Medicaid Other | Attending: Psychiatry | Admitting: Psychiatry

## 2017-01-12 ENCOUNTER — Encounter (HOSPITAL_COMMUNITY): Payer: Self-pay | Admitting: *Deleted

## 2017-01-12 DIAGNOSIS — Z79899 Other long term (current) drug therapy: Secondary | ICD-10-CM | POA: Diagnosis not present

## 2017-01-12 DIAGNOSIS — K5900 Constipation, unspecified: Secondary | ICD-10-CM | POA: Diagnosis present

## 2017-01-12 DIAGNOSIS — F322 Major depressive disorder, single episode, severe without psychotic features: Secondary | ICD-10-CM | POA: Diagnosis present

## 2017-01-12 DIAGNOSIS — Z91040 Latex allergy status: Secondary | ICD-10-CM

## 2017-01-12 DIAGNOSIS — F4310 Post-traumatic stress disorder, unspecified: Secondary | ICD-10-CM | POA: Diagnosis present

## 2017-01-12 DIAGNOSIS — F29 Unspecified psychosis not due to a substance or known physiological condition: Secondary | ICD-10-CM | POA: Diagnosis present

## 2017-01-12 DIAGNOSIS — Z8669 Personal history of other diseases of the nervous system and sense organs: Secondary | ICD-10-CM | POA: Diagnosis not present

## 2017-01-12 DIAGNOSIS — F4001 Agoraphobia with panic disorder: Secondary | ICD-10-CM | POA: Diagnosis present

## 2017-01-12 DIAGNOSIS — M797 Fibromyalgia: Secondary | ICD-10-CM | POA: Diagnosis present

## 2017-01-12 DIAGNOSIS — R000 Tachycardia, unspecified: Secondary | ICD-10-CM | POA: Diagnosis present

## 2017-01-12 DIAGNOSIS — M542 Cervicalgia: Secondary | ICD-10-CM | POA: Diagnosis present

## 2017-01-12 DIAGNOSIS — F314 Bipolar disorder, current episode depressed, severe, without psychotic features: Principal | ICD-10-CM | POA: Diagnosis present

## 2017-01-12 DIAGNOSIS — R45851 Suicidal ideations: Secondary | ICD-10-CM | POA: Diagnosis present

## 2017-01-12 DIAGNOSIS — G4700 Insomnia, unspecified: Secondary | ICD-10-CM | POA: Diagnosis present

## 2017-01-12 DIAGNOSIS — F3130 Bipolar disorder, current episode depressed, mild or moderate severity, unspecified: Secondary | ICD-10-CM | POA: Diagnosis present

## 2017-01-12 DIAGNOSIS — Z90710 Acquired absence of both cervix and uterus: Secondary | ICD-10-CM

## 2017-01-12 DIAGNOSIS — K219 Gastro-esophageal reflux disease without esophagitis: Secondary | ICD-10-CM | POA: Diagnosis present

## 2017-01-12 DIAGNOSIS — F419 Anxiety disorder, unspecified: Secondary | ICD-10-CM | POA: Diagnosis present

## 2017-01-12 DIAGNOSIS — Z888 Allergy status to other drugs, medicaments and biological substances status: Secondary | ICD-10-CM | POA: Diagnosis not present

## 2017-01-12 DIAGNOSIS — F603 Borderline personality disorder: Secondary | ICD-10-CM | POA: Diagnosis present

## 2017-01-12 DIAGNOSIS — Z818 Family history of other mental and behavioral disorders: Secondary | ICD-10-CM

## 2017-01-12 DIAGNOSIS — F17210 Nicotine dependence, cigarettes, uncomplicated: Secondary | ICD-10-CM | POA: Diagnosis present

## 2017-01-12 DIAGNOSIS — F410 Panic disorder [episodic paroxysmal anxiety] without agoraphobia: Secondary | ICD-10-CM | POA: Clinically undetermined

## 2017-01-12 DIAGNOSIS — F3181 Bipolar II disorder: Secondary | ICD-10-CM | POA: Diagnosis present

## 2017-01-12 DIAGNOSIS — I10 Essential (primary) hypertension: Secondary | ICD-10-CM | POA: Diagnosis present

## 2017-01-12 DIAGNOSIS — B259 Cytomegaloviral disease, unspecified: Secondary | ICD-10-CM | POA: Diagnosis present

## 2017-01-12 MED ORDER — TRAZODONE HCL 50 MG PO TABS
50.0000 mg | ORAL_TABLET | Freq: Every evening | ORAL | Status: DC | PRN
  Administered 2017-01-12 – 2017-01-13 (×2): 50 mg via ORAL
  Filled 2017-01-12 (×2): qty 1

## 2017-01-12 MED ORDER — HYDROXYZINE HCL 25 MG PO TABS
25.0000 mg | ORAL_TABLET | Freq: Three times a day (TID) | ORAL | Status: DC | PRN
  Administered 2017-01-12 – 2017-01-17 (×10): 25 mg via ORAL
  Filled 2017-01-12 (×10): qty 1

## 2017-01-12 MED ORDER — ALUM & MAG HYDROXIDE-SIMETH 200-200-20 MG/5ML PO SUSP
30.0000 mL | ORAL | Status: DC | PRN
  Administered 2017-01-18: 30 mL via ORAL
  Filled 2017-01-12: qty 30

## 2017-01-12 MED ORDER — ACETAMINOPHEN 325 MG PO TABS
650.0000 mg | ORAL_TABLET | Freq: Four times a day (QID) | ORAL | Status: DC | PRN
  Administered 2017-01-12 – 2017-01-14 (×4): 650 mg via ORAL
  Filled 2017-01-12 (×4): qty 2

## 2017-01-12 MED ORDER — MAGNESIUM HYDROXIDE 400 MG/5ML PO SUSP
30.0000 mL | Freq: Every day | ORAL | Status: DC | PRN
  Administered 2017-01-12 – 2017-01-15 (×3): 30 mL via ORAL
  Filled 2017-01-12 (×3): qty 30
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
## 2017-01-12 NOTE — Tx Team (Signed)
Initial Treatment Plan 01/12/2017 11:03 PM Rita Lewis ZOX:[id number]    PATIENT STRESSORS: Loss of custody of 5 children Marital or family conflict Occupational concerns   PATIENT STRENGTHS: Capable of independent living Communication skills General fund of knowledge Motivation for treatment/growth   PATIENT IDENTIFIED PROBLEMS: Depression "I have a lot reasons why I am depressed in life"  Suicide Ideation "It comes and goes. Nothing too serious"                   DISCHARGE CRITERIA:  Improved stabilization in mood, thinking, and/or behavior Motivation to continue treatment in a less acute level of care Verbal commitment to aftercare and medication compliance  PRELIMINARY DISCHARGE PLAN: Attend aftercare/continuing care group Outpatient therapy Referrals indicated:  Psych. outpatient   PATIENT/FAMILY INVOLVEMENT: This treatment plan has been presented to and reviewed with the patient, Rita Lewis, and/or family member.  The patient and family have been given the opportunity to ask questions and make suggestions.  Earleen Newport, RN 01/12/2017, 11:03 PM

## 2017-01-13 DIAGNOSIS — Z79899 Other long term (current) drug therapy: Secondary | ICD-10-CM

## 2017-01-13 DIAGNOSIS — F410 Panic disorder [episodic paroxysmal anxiety] without agoraphobia: Secondary | ICD-10-CM

## 2017-01-13 LAB — LIPID PANEL
CHOLESTEROL: 195 mg/dL (ref 0–200)
HDL: 51 mg/dL (ref >40)
LDL Cholesterol: 127 mg/dL — ABNORMAL HIGH (ref 0–99)
TRIGLYCERIDES: 85 mg/dL (ref <150)
Total CHOL/HDL Ratio: 3.8 RATIO
VLDL: 17 mg/dL (ref 0–40)

## 2017-01-13 MED ORDER — NICOTINE 21 MG/24HR TD PT24
21.0000 mg | MEDICATED_PATCH | Freq: Every day | TRANSDERMAL | Status: DC
  Administered 2017-01-14 – 2017-01-18 (×5): 21 mg via TRANSDERMAL
  Filled 2017-01-13 (×7): qty 1

## 2017-01-13 MED ORDER — LORAZEPAM 0.5 MG PO TABS
0.5000 mg | ORAL_TABLET | Freq: Four times a day (QID) | ORAL | Status: DC | PRN
  Administered 2017-01-14 – 2017-01-17 (×6): 0.5 mg via ORAL
  Filled 2017-01-13 (×6): qty 1

## 2017-01-13 MED ORDER — ARIPIPRAZOLE 5 MG PO TABS
5.0000 mg | ORAL_TABLET | Freq: Every day | ORAL | Status: DC
  Administered 2017-01-13 – 2017-01-16 (×4): 5 mg via ORAL
  Filled 2017-01-13 (×6): qty 1

## 2017-01-13 MED ORDER — ESCITALOPRAM OXALATE 5 MG PO TABS
5.0000 mg | ORAL_TABLET | Freq: Every day | ORAL | Status: DC
  Administered 2017-01-13 – 2017-01-18 (×6): 5 mg via ORAL
  Filled 2017-01-13 (×8): qty 1

## 2017-01-13 NOTE — Progress Notes (Signed)
Pt attended the evening AA speaker meeting. Pt was engaged and appropriate. Sofhia Ulibarri C, NT 01/13/17 8:42 PM 

## 2017-01-13 NOTE — [facility] Suicide Risk Assessment (Signed)
[facility] Admission Suicide Risk Assessment   Nursing information obtained from:    patient and chart  Demographic factors:   31 year old female, 5 children, currently unemployed  Current Mental Status:   see below Loss Factors:   not having custody of her children at this time Historical Factors:   depression, anxiety,  brief episodes of increased energy Risk Reduction Factors:   resilience, sense of responsibility toward her children  Total Time spent with patient: 45 minutes Principal Problem:  Depression, Panic  Diagnosis:   Patient Active Problem List   Diagnosis Date Noted  . Severe major depression (HCC) [F32.2] 01/12/2017    Continued Clinical Symptoms:  Alcohol Use Disorder Identification Test Final Score (AUDIT): 0 The "Alcohol Use Disorders Identification Test", Guidelines for Use in Primary Care, Second Edition.  World Science writer [facility]). Score between 0-7:  no or low risk or alcohol related problems. Score between 8-15:  moderate risk of alcohol related problems. Score between 16-19:  high risk of alcohol related problems. Score 20 or above:  warrants further diagnostic evaluation for alcohol dependence and treatment.   CLINICAL FACTORS:  31 year old female, history of depression, reports also history of brief episodes of increased energy, decreased need for sleep. Presented to ED for headaches, reported panic attacks, depression and passive SI.    Psychiatric Specialty Exam: Physical Exam  ROS  Blood pressure 126/88, pulse (!) 128, temperature 98.1 F (36.7 C), temperature source Oral, resp. rate 18, height  (1.676 m), weight 71.7 kg (158 lb), SpO2 99 %.Body mass index is 25.5 kg/m.   see admit note MSE    COGNITIVE FEATURES THAT CONTRIBUTE TO RISK:  Closed-mindedness and Loss of executive function    SUICIDE RISK:   Moderate:  Frequent suicidal ideation with limited intensity, and duration, some specificity in terms of plans, no associated intent, good  self-control, limited dysphoria/symptomatology, some risk factors present, and identifiable protective factors, including available and accessible social support.  PLAN OF CARE: Patient will be admitted to inpatient psychiatric unit for stabilization and safety. Will provide and encourage milieu participation. Provide medication management and maked adjustments as needed.  Will follow daily.    I certify that inpatient services furnished can reasonably be expected to improve the patient's condition.   Craige Cotta, MD 01/13/2017, 9:05 AM

## 2017-01-13 NOTE — [facility] Counselor (Signed)
CSW attempted to complete PSA with pt. Pt requested that CSW return at a later time.   Jonathon Jordan, MSW, Theresia Majors [phone]

## 2017-01-13 NOTE — [facility] Group Notes (Signed)
[facility] LCSW Group Therapy 01/13/2017 1:15pm  Type of Therapy: Group Therapy- Feelings Around Relapse and Recovery  Participation Level: Minimal  Participation Quality: N/A  Affect:  Flat  Cognitive: Alert and Oriented   Insight:  Unable to assess  Engagement in Therapy: Minimal  Modes of Intervention: Clarification, Confrontation, Discussion, Education, Exploration, Limit-setting, Orientation, Problem-solving, Rapport Building, Dance movement psychotherapist, Socialization and Support  Summary of Progress/Problems: The topic for today was feelings about relapse. The group discussed what relapse prevention is to them and identified triggers that they are on the path to relapse. Members also processed their feeling towards relapse and were able to relate to common experiences. Group also discussed coping skills that can be used for relapse prevention.  Pt did not participate in group discussion.    Therapeutic Modalities:   Cognitive Behavioral Therapy Solution-Focused Therapy Assertiveness Training Relapse Prevention Therapy    Rita Lewis [phone] 01/13/2017 4:14 PM

## 2017-01-13 NOTE — Progress Notes (Signed)
Recreation Therapy Notes  Date: 01/13/17 Time: 0930 Location: 400 Hall Dayroom  Group Topic: Stress Management  Goal Area(s) Addresses:  Patient will verbalize importance of using healthy stress management.  Patient will identify positive emotions associated with healthy stress management.   Behavioral Response: Engaged  Intervention: Stress Management  Activity :  Forest Visualization.  LRT introduced the stress management technique of guided imagery.  LRT read Lewis script to allow patients to engage in the technique.  Patients were to follow along as LRT read the script to participate in the stress management technique.  Education:  Stress Management, Discharge Planning.   Education Outcome: Acknowledges edcuation/In group clarification offered/Needs additional education  Clinical Observations/Feedback: Pt attended group.   Rita Lewis, LRT/CTRS         Rita Lewis 01/13/2017 12:21 PM 

## 2017-01-13 NOTE — Progress Notes (Signed)
Writer spoke with patient 1:1 and she requested that her nicotine patch be changed reporting that she had been wearing it since yesterday. She reported that she has been having neck pain today and can feel her heart rate increase from time to time. She reported that her doctor told her this may be related to her panic attacks.She reported that her goal is to work on getting rid of her increased anxiety. She received a visteril to help with her feeling anxious and a nicotine patch. Support given and safety maintained on unit with 15 min checks.Marland Kitchen

## 2017-01-13 NOTE — Tx Team (Signed)
Interdisciplinary Treatment and Diagnostic Plan Update 01/13/2017 Time of Session: 9:30am  Rita Lewis  MRN: [id number]  Principal Diagnosis: Bipolar Disorder , Depressed,  Panic Disorder   Secondary Diagnoses: Active Problems:   Severe major depression (HCC)   Current Medications:  Current Facility-Administered Medications  Medication Dose Route Frequency Provider Last Rate Last Dose  . acetaminophen (TYLENOL) tablet 650 mg  650 mg Oral Q6H PRN Rozetta Nunnery, NP   650 mg at 01/13/17 0811  . alum & mag hydroxide-simeth (MAALOX/MYLANTA) 200-200-20 MG/5ML suspension 30 mL  30 mL Oral Q4H PRN Rozetta Nunnery, NP      . ARIPiprazole (ABILIFY) tablet 5 mg  5 mg Oral Daily Jenne Campus, MD   5 mg at 01/13/17 1208  . escitalopram (LEXAPRO) tablet 5 mg  5 mg Oral Daily Jenne Campus, MD   5 mg at 01/13/17 1208  . hydrOXYzine (ATARAX/VISTARIL) tablet 25 mg  25 mg Oral TID PRN Rozetta Nunnery, NP   25 mg at 01/13/17 0811  . LORazepam (ATIVAN) tablet 0.5 mg  0.5 mg Oral Q6H PRN Myer Peer Cobos, MD      . magnesium hydroxide (MILK OF MAGNESIA) suspension 30 mL  30 mL Oral Daily PRN Rozetta Nunnery, NP   30 mL at 01/13/17 0347  . traZODone (DESYREL) tablet 50 mg  50 mg Oral QHS PRN Rozetta Nunnery, NP   50 mg at 01/12/17 2058    PTA Medications: No prescriptions prior to admission.    Treatment Modalities: Medication Management, Group therapy, Case management,  1 to 1 session with clinician, Psychoeducation, Recreational therapy.  Patient Stressors: Loss of custody of 5 children Marital or family conflict Occupational concerns Patient Strengths: Capable of independent living Curator fund of knowledge Motivation for treatment/growth  Physician Treatment Plan for Primary Diagnosis: Bipolar Disorder , Depressed,  Panic Disorder  Long Term Goal(s): Improvement in symptoms so as ready for discharge Short Term Goals: Ability to verbalize feelings will improve Ability to  disclose and discuss suicidal ideas Ability to demonstrate self-control will improve Ability to identify and develop effective coping behaviors will improve Ability to maintain clinical measurements within normal limits will improve Ability to verbalize feelings will improve Ability to disclose and discuss suicidal ideas Ability to demonstrate self-control will improve Ability to identify and develop effective coping behaviors will improve Ability to maintain clinical measurements within normal limits will improve  Medication Management: Evaluate patient's response, side effects, and tolerance of medication regimen.  Therapeutic Interventions: 1 to 1 sessions, Unit Group sessions and Medication administration.  Evaluation of Outcomes: Not Met  Physician Treatment Plan for Secondary Diagnosis: Active Problems:   Severe major depression ([place])  Long Term Goal(s): Improvement in symptoms so as ready for discharge  Short Term Goals: Ability to verbalize feelings will improve Ability to disclose and discuss suicidal ideas Ability to demonstrate self-control will improve Ability to identify and develop effective coping behaviors will improve Ability to maintain clinical measurements within normal limits will improve Ability to verbalize feelings will improve Ability to disclose and discuss suicidal ideas Ability to demonstrate self-control will improve Ability to identify and develop effective coping behaviors will improve Ability to maintain clinical measurements within normal limits will improve  Medication Management: Evaluate patient's response, side effects, and tolerance of medication regimen.  Therapeutic Interventions: 1 to 1 sessions, Unit Group sessions and Medication administration.  Evaluation of Outcomes: Not Met  RN Treatment Plan for Primary Diagnosis: Bipolar Disorder ,  Depressed,  Panic Disorder  Long Term Goal(s): Knowledge of disease and therapeutic regimen to  maintain health will improve  Short Term Goals: Ability to remain free from injury will improve and Compliance with prescribed medications will improve  Medication Management: RN will administer medications as ordered by provider, will assess and evaluate patient's response and provide education to patient for prescribed medication. RN will report any adverse and/or side effects to prescribing provider.  Therapeutic Interventions: 1 on 1 counseling sessions, Psychoeducation, Medication administration, Evaluate responses to treatment, Monitor vital signs and CBGs as ordered, Perform/monitor CIWA, COWS, AIMS and Fall Risk screenings as ordered, Perform wound care treatments as ordered.  Evaluation of Outcomes: Not Met  LCSW Treatment Plan for Primary Diagnosis: Bipolar Disorder , Depressed,  Panic Disorder  Long Term Goal(s): Safe transition to appropriate next level of care at discharge, Engage patient in therapeutic group addressing interpersonal concerns. Short Term Goals: Engage patient in aftercare planning with referrals and resources, Increase ability to appropriately verbalize feelings, Identify triggers associated with mental health/substance abuse issues and Increase skills for wellness and recovery  Therapeutic Interventions: Assess for all discharge needs, 1 to 1 time with Social worker, Explore available resources and support systems, Assess for adequacy in community support network, Educate family and significant other(s) on suicide prevention, Complete Psychosocial Assessment, Interpersonal group therapy.  Evaluation of Outcomes: Not Met  Progress in Treatment: Attending groups: Pt is new to milieu, continuing to assess  Participating in groups: Pt is new to milieu, continuing to assess  Taking medication as prescribed: Yes, MD continues to assess for medication changes as needed Toleration medication: Yes, no side effects reported at this time Family/Significant other contact  made: No, CSW assessing for appropriate contact Patient understands diagnosis: Continuing to assess Discussing patient identified problems/goals with staff: Yes Medical problems stabilized or resolved: Yes Denies suicidal/homicidal ideation: No, pt recently admitted with suicidal ideation. Issues/concerns per patient self-inventory: None Other: N/A  New problem(s) identified: None identified at this time.   New Short Term/Long Term Goal(s): None identified at this time.   Discharge Plan or Barriers: CSW still assessing for an appropriate plan.  Reason for Continuation of Hospitalization:  Anxiety  Depression Medication stabilization Suicidal ideation  Estimated Length of Stay: 3-5 days  Attendees: Patient: 01/13/2017 3:51 PM  Physician: Dr. Parke Poisson 01/13/2017 3:51 PM  Nursing: Chong Sicilian RN; Kieth Brightly, [place] 01/13/2017 3:51 PM  RN Care Manager: Lars Pinks, RN 01/13/2017 3:51 PM  Social Worker: Matthew Saras, Clarinda 01/13/2017 3:51 PM  Recreational Therapist:  01/13/2017 3:51 PM  Other: Lindell Spar, NP 01/13/2017 3:51 PM  Other:  01/13/2017 3:51 PM  Other: 01/13/2017 3:51 PM   Scribe for Treatment Team: Georga Kaufmann, MSW,LCSWA 01/13/2017 3:51 PM\

## 2017-01-13 NOTE — H&P (Signed)
Psychiatric Admission Assessment Adult  Patient Identification: Rita Lewis MRN:  [id number] Date of Evaluation:  01/13/2017 Chief Complaint:  " I told them I had suicidal thoughts " Principal Diagnosis:  Bipolar Disorder , Depressed,  Panic Disorder  Diagnosis:   Patient Active Problem List   Diagnosis Date Noted  . Severe major depression (HCC) [F32.2] 01/12/2017   History of Present Illness: 31 year old female, who went to the ED [facility]) for occipital, neck pain pressure. Description suggests panic attacks. States " it starts like a pressure on the back of my head, and then my heart starts racing, my lips feel numb, and I start tingling all over. States " I get very anxious, because I feel like I am going to have a heart attack or something". These episodes last about 15-20 minutes. States she has had these symptoms for years, but they have been becoming more frequent . During her ED assessment , she states " they asked me if I was suicidal and I said " maybe" ".Does endorse depression, feeling " empty", and passive thoughts of death, suicide, but no actual plan or intention. Endorses neuro-vegetative symptoms as above .   Associated Signs/Symptoms: Depression Symptoms:  depressed mood, insomnia, suicidal thoughts without plan, anxiety, panic attacks, loss of energy/fatigue,  Decreased sense of self esteem Stable appetite  (Hypo) Manic Symptoms:  Does not endorse  Anxiety Symptoms:  Describes episodes suggestive of panic attacks , as above . Endorses agoraphobia.  Psychotic Symptoms:  States she does sometimes hears voices, but states " I am from [place], and my mother believed that we had the gift of being able to hear things other people don't, but I think that it's my own thoughts ". ( Does not appear internally preoccupied) . No delusions . PTSD Symptoms: Reports some PTSD symptoms such as hypervigilance, occasional nightmares , intrusive recollections stemming from childhood  trauma.  Total Time spent with patient: 45 minutes  Past Psychiatric History: Reports history of depression. Has had 2  prior psychiatric admissions. Last one 2014. ( for depression and suicidal ideations). Has attempted suicide in the past, as a teenager, and in 2014, by ingesting chemical " clorox".  States she has had brief episodes ( usually about a day) of increased energy, decreased need for sleep " being too happy, too loud ".  As above, endorses history of panic attacks and also endorses agoraphobia. Denies history of violence    Is the patient at risk to self? Yes.    Has the patient been a risk to self in the past 6 months? Yes.    Has the patient been a risk to self within the distant past? Yes.    Is the patient a risk to others? No.  Has the patient been a risk to others in the past 6 months? No.  Has the patient been a risk to others within the distant past? No.   Prior Inpatient Therapy:  as above  Prior Outpatient Therapy:  not at this time.   Alcohol Screening: Patient refused Alcohol Screening Tool: Yes 1. How often do you have a drink containing alcohol?: Never 9. Have you or someone else been injured as a result of your drinking?: No 10. Has a relative or friend or a doctor or another health worker been concerned about your drinking or suggested you cut down?: No Alcohol Use Disorder Identification Test Final Score (AUDIT): 0 Brief Intervention: Patient declined brief intervention Substance Abuse History in the last 12  months: denies any alcohol or drug abuse. Reports past history of cannabis use disorder, but stopped in 2006.  Consequences of Substance Abuse: States she was charged with DUI in the past, because " I was on Xanax, but it was prescribed ".  Previous Psychotropic Medications: States that she has been on Risperidone, Imipramine, Xanax, but has not been taking any psychiatric medications since January, after she ran out of medications Psychological  Evaluations:No  Past Medical History:  Past Medical History:  Diagnosis Date  . Anxiety   . Bipolar 2 disorder (HCC)   . Borderline personality disorder   . Cytomegalovirus (CMV) viremia (HCC)   . Fibromyalgia   . Hypertension   . Manic depression (HCC)   . Migraines   . Psychosis     Past Surgical History:  Procedure Laterality Date  . ABDOMINAL HYSTERECTOMY    . TUBAL LIGATION     Family History: parents alive, separated . Has 4 sisters , 2 brothers  Family History  Problem Relation Age of Onset  . Hypertension Other   . Diabetes Other   . Stroke Other    Family Psychiatric  History:  States maternal grandfather had schizophrenia, father alcoholic. No suicides in family  Tobacco Screening: Have you used any form of tobacco in the last 30 days? (Cigarettes, Smokeless Tobacco, Cigars, and/or Pipes): Yes Tobacco use, Select all that apply: 5 or more cigarettes per day Are you interested in Tobacco Cessation Medications?: No, patient refused Counseled patient on smoking cessation including recognizing danger situations, developing coping skills and basic information about quitting provided: Refused/Declined practical counseling Social History: living with step-aunt, currently unemployed, no source of income at this time, has 5 children, ranging from 5-13, currently in the custody of their fathers, currently on probation.  History  Alcohol Use No     History  Drug Use No    Additional Social History:  Allergies:   Allergies  Allergen Reactions  . Gabapentin Swelling  . Lyrica [Pregabalin] Swelling  . Solu-Medrol [Methylprednisolone] Swelling    Shortness of breath  . Topamax [Topiramate] Swelling  . Latex Rash   Lab Results: No results found for this or any previous visit (from the past 48 hour(s)).  Blood Alcohol level:  No results found for: [facility]  Metabolic Disorder Labs:  No results found for: HGBA1C, MPG No results found for: PROLACTIN No results found for:  CHOL, TRIG, HDL, CHOLHDL, VLDL, LDLCALC  Current Medications: Current Facility-Administered Medications  Medication Dose Route Frequency Provider Last Rate Last Dose  . acetaminophen (TYLENOL) tablet 650 mg  650 mg Oral Q6H PRN Jackelyn Poling, NP   650 mg at 01/13/17 0811  . alum & mag hydroxide-simeth (MAALOX/MYLANTA) 200-200-20 MG/5ML suspension 30 mL  30 mL Oral Q4H PRN Jackelyn Poling, NP      . hydrOXYzine (ATARAX/VISTARIL) tablet 25 mg  25 mg Oral TID PRN Jackelyn Poling, NP   25 mg at 01/13/17 0811  . magnesium hydroxide (MILK OF MAGNESIA) suspension 30 mL  30 mL Oral Daily PRN Jackelyn Poling, NP   30 mL at 01/13/17 0347  . traZODone (DESYREL) tablet 50 mg  50 mg Oral QHS PRN Jackelyn Poling, NP   50 mg at 01/12/17 2058   PTA Medications: No prescriptions prior to admission.    Musculoskeletal: Strength & Muscle Tone: within normal limits Gait & Station: normal Patient leans: N/A  Psychiatric Specialty Exam: Physical Exam  Review of Systems  Constitutional: Negative.  HENT: Negative.   Eyes: Negative.   Respiratory: Negative.   Cardiovascular: Negative.  Negative for chest pain and palpitations.       Intermittent palpitations, as above- in the context of probable panic attacks  Gastrointestinal: Negative.   Genitourinary: Negative.   Musculoskeletal: Negative.   Skin: Negative.   Neurological: Negative for seizures.  Endo/Heme/Allergies: Negative.   Psychiatric/Behavioral: Positive for depression and suicidal ideas. The patient is nervous/anxious.   All other systems reviewed and are negative.   Blood pressure 126/88, pulse (!) 128, temperature 98.1 F (36.7 C), temperature source Oral, resp. rate 18, height  (1.676 m), weight 71.7 kg (158 lb), SpO2 99 %.Body mass index is 25.5 kg/m.  General Appearance: Fairly Groomed  Eye Contact:  Good  Speech:  Normal Rate  Volume:  Normal  Mood:  Depressed  Affect:  constricted, tearful at times, anxious  Thought Process:   Goal Directed and Linear  Orientation:  Full (Time, Place, and Person)  Thought Content:  at this time denies hallucinations, no delusions expressed, does not appear internally preoccupied   Suicidal Thoughts:  No denies any current suicidal plan or intention and contracts for safety on unit, denies any homicidal or violent ideations.  Identifies love for her children as protective factor against harming self .  Homicidal Thoughts:  No  Memory:  recent and remote grossly intact   Judgement:  Fair  Insight:  Fair  Psychomotor Activity:  Normal  Concentration:  Concentration: Good and Attention Span: Good  Recall:  Good  Fund of Knowledge:  Good  Language:  Good  Akathisia:  Negative  Handed:  Right  AIMS (if indicated):     Assets:  Communication Skills Desire for Improvement Resilience  ADL's:  Intact  Cognition:  WNL  Sleep:  Number of Hours: 3    Treatment Plan Summary: Daily contact with patient to assess and evaluate symptoms and progress in treatment, Medication management, Plan inpatient admission  and medications as below  Observation Level/Precautions:  15 minute checks  Laboratory: as needed- TSH   Psychotherapy:  Milieu, group therapy   Medications:  We discussed options Start LEXAPRO 5 mgrs QDAY for depression, panic Start ABILIFY 5 mgrs QDAY as mood stabilizer Start ATIVAN 0.5 mgrs Q 6 hours PRN for anxiety as needed  Consultations:  As needed   Discharge Concerns: -    Estimated LOS: 5 days   Other:     Physician Treatment Plan for Primary Diagnosis: Bipolar Disorder, Depressed  Long Term Goal(s): Improvement in symptoms so as ready for discharge  Short Term Goals: Ability to verbalize feelings will improve, Ability to disclose and discuss suicidal ideas, Ability to demonstrate self-control will improve, Ability to identify and develop effective coping behaviors will improve and Ability to maintain clinical measurements within normal limits will  improve  Physician Treatment Plan for Secondary Diagnosis: Panic Disorder with Agoraphobia Long Term Goal(s): Improvement in symptoms so as ready for discharge  Short Term Goals: Ability to verbalize feelings will improve, Ability to disclose and discuss suicidal ideas, Ability to demonstrate self-control will improve, Ability to identify and develop effective coping behaviors will improve and Ability to maintain clinical measurements within normal limits will improve  I certify that inpatient services furnished can reasonably be expected to improve the patient's condition.    Craige Cotta, MD 4/20/20188:31 AM

## 2017-01-13 NOTE — Progress Notes (Signed)
[name] is seen OOB standing at the [employer] window this moring. She is shy, nervous, wearing a frown and obviously hesitant to share info with this Probation officer. She speaks very very quietly, into her chest, mumbling often ( to the point that writer repeatedly clarifying with her what she said). She is guarded. Nervous,. Cautious. She avoids eye contact with this Probation officer. AShe answers all of writers questions, as best she can . Her admission status is changed to voluntary, per request from Dr. Parke Poisson. This is explained to pt by this Probation officer.  She requested 2 tylenol and 1 vistaril this morning and this waas given to pt, at 0811. At 0911, pt stated her anxiety was " a little " better " but not all that much". This evening, she requested and was given more tylenol  At 1723( for c/o back, side and head pain). And stated " a little relief" one hour later when asked by this Probation officer. R Safety is in place. MD met with pt discussed meds and poc. Labs outstanding and lexapro and abilify to be scheduled and prn ativan to be used for anxiety / agitation.

## 2017-01-13 NOTE — Progress Notes (Signed)
Pt did not attend karaoke wrap up as she had just arrived to the unit. Caswell Corwin, NT 01/12/2017 2000

## 2017-01-14 DIAGNOSIS — F3130 Bipolar disorder, current episode depressed, mild or moderate severity, unspecified: Secondary | ICD-10-CM | POA: Diagnosis present

## 2017-01-14 DIAGNOSIS — F410 Panic disorder [episodic paroxysmal anxiety] without agoraphobia: Secondary | ICD-10-CM | POA: Clinically undetermined

## 2017-01-14 DIAGNOSIS — F4310 Post-traumatic stress disorder, unspecified: Secondary | ICD-10-CM | POA: Diagnosis present

## 2017-01-14 DIAGNOSIS — F17210 Nicotine dependence, cigarettes, uncomplicated: Secondary | ICD-10-CM

## 2017-01-14 LAB — TSH: TSH: 2.569 u[IU]/mL (ref 0.350–4.500)

## 2017-01-14 LAB — LIPID PANEL
CHOL/HDL RATIO: 4.5 ratio
Cholesterol: 221 mg/dL — ABNORMAL HIGH (ref 0–200)
HDL: 49 mg/dL (ref >40)
LDL Cholesterol: 135 mg/dL — ABNORMAL HIGH (ref 0–99)
Triglycerides: 184 mg/dL — ABNORMAL HIGH (ref <150)
VLDL: 37 mg/dL (ref 0–40)

## 2017-01-14 LAB — HEMOGLOBIN A1C
HEMOGLOBIN A1C: 4.9 % (ref 4.8–5.6)
Mean Plasma Glucose: 94 mg/dL

## 2017-01-14 MED ORDER — IBUPROFEN 400 MG PO TABS
400.0000 mg | ORAL_TABLET | Freq: Two times a day (BID) | ORAL | Status: DC | PRN
  Administered 2017-01-18: 400 mg via ORAL
  Filled 2017-01-14 (×2): qty 1

## 2017-01-14 MED ORDER — DIPHENHYDRAMINE HCL 25 MG PO CAPS
ORAL_CAPSULE | ORAL | Status: AC
  Filled 2017-01-14: qty 2

## 2017-01-14 MED ORDER — MENTHOL 3 MG MT LOZG
1.0000 | LOZENGE | Freq: Four times a day (QID) | OROMUCOSAL | Status: AC
Start: 2017-01-14 — End: 2017-01-16
  Administered 2017-01-14 (×2): 3 mg via ORAL

## 2017-01-14 MED ORDER — DIPHENHYDRAMINE HCL 50 MG PO CAPS
50.0000 mg | ORAL_CAPSULE | Freq: Once | ORAL | Status: AC
  Administered 2017-01-14: 50 mg via ORAL

## 2017-01-14 MED ORDER — NON FORMULARY: 1.0000 mg | Freq: Every day | Status: DC

## 2017-01-14 MED ORDER — IBUPROFEN 400 MG PO TABS
400.0000 mg | ORAL_TABLET | Freq: Two times a day (BID) | ORAL | Status: DC
  Administered 2017-01-14 – 2017-01-15 (×3): 400 mg via ORAL
  Filled 2017-01-14 (×7): qty 1

## 2017-01-14 MED ORDER — ESZOPICLONE 2 MG PO TABS
1.0000 mg | ORAL_TABLET | Freq: Every day | ORAL | Status: DC
  Administered 2017-01-14: 1 mg via ORAL
  Filled 2017-01-14: qty 1

## 2017-01-14 MED ORDER — ACETAMINOPHEN 325 MG PO TABS
650.0000 mg | ORAL_TABLET | Freq: Four times a day (QID) | ORAL | Status: DC
  Administered 2017-01-14: 650 mg via ORAL
  Filled 2017-01-14 (×9): qty 2

## 2017-01-14 MED ORDER — CYCLOBENZAPRINE HCL 10 MG PO TABS
5.0000 mg | ORAL_TABLET | Freq: Three times a day (TID) | ORAL | Status: DC | PRN
  Administered 2017-01-14 – 2017-01-16 (×3): 5 mg via ORAL
  Filled 2017-01-14 (×3): qty 1

## 2017-01-14 NOTE — Progress Notes (Signed)
Patient ID: Rita Lewis, female   DOB: [birth year], 31 y.o.   MRN: [id number]    EKG was completed and placed on chart. Jacquelyne Balint RN

## 2017-01-14 NOTE — Progress Notes (Signed)
Patient ID: Rita Lewis, female   DOB: 09/09/1986, 31 y.o.   MRN: [id number]    D: Pt has been very flat and depressed on the unit today, she has complained and requested medication for her withdrawals. Pt reported that she had a headache, body aches, stomach cramps, muscle spasms, and anxiety. Pt was given Ativan and Flexeril, with relief. Pt reported that her depression was a 0, her hopelessness was a 2, and her anxiety was a 4. Pt reported that her goal for today was to get rid of migraine and work on anxiety. Pt reported being negative SI/HI, no AH/VH noted. A: 15 min checks continued for patient safety. R: Pt safety maintained.

## 2017-01-14 NOTE — Progress Notes (Signed)
Writer spoke with patient 1:1 and she reports that her day has been a little better other than reporting that she felt that her glands in hr throat were swollen. She received scheduled motrin and was offered the cepacol but refused reporting her throat was swollen and not sore. She was aware of her sleep medication changes and is hopeful it will work. She reported that she will need to get numbers out of her cell phone in her locker to call her probation officer on Monday. Support given and safety maintained on unit with 15 min checks.

## 2017-01-14 NOTE — Progress Notes (Signed)
Adult Psychoeducational Group Note  Date:  01/14/2017 Time:  9:34 PM  Group Topic/Focus:  Wrap-Up Group:   The focus of this group is to help patients review their daily goal of treatment and discuss progress on daily workbooks.  Participation Level:  Active  Participation Quality:  Appropriate and Attentive  Affect:  Appropriate  Cognitive:  Appropriate  Insight: Appropriate  Engagement in Group:  Engaged  Modes of Intervention:  Discussion  Additional Comments:  Pt stated her goal for today was to nap and sleep, which she was able to do. Pt stated she has been out of her room and opened up more and she ate 3 meals today.  Caswell Corwin 01/14/2017, 9:34 PM
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
## 2017-01-14 NOTE — [facility] Group Notes (Signed)
[facility] LCSW Group Therapy Note  01/14/2017  and  10:00 AM  Type of Therapy and Topic:  Group Therapy: Avoiding Self-Sabotaging and Enabling Behaviors  Participation Level: Minimal  Participation Quality:  Attentive  Affect:  Flat  Cognitive:  Alert  Insight:  Limited  Engagement in Therapy:  Engaged   Therapeutic models used: Cognitive Behavioral Therapy,  Person-Centered Therapy and Motivational Interviewing  Modes of Intervention:  Discussion, Exploration, Orientation, Rapport Building, Socialization and Support  Summary of Progress/Problems:  The main focus of today's process group was for the patient to identify ways in which they have in the past sabotaged their own recovery. Motivational Interviewing was utilized to identify motivation they may have for wanting to change. Patients were able to process how their ways of coping may have negative impacts on their well being. Patient was out of group for some time to meet with other members of of treatment team. Patient identified rumination and negative thoughts as patterns.   Carney Bern, LCSW
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
## 2017-01-14 NOTE — Progress Notes (Signed)
Patient ID: Rita Lewis, female   DOB: 1986-03-25, 31 y.o.   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 Pt reported that her tongue was itchy, new orders noted from [facility] NP to give Benadryl  po times one now. Benadryl given,

## 2017-01-15 MED ORDER — IBUPROFEN 400 MG PO TABS
400.0000 mg | ORAL_TABLET | Freq: Two times a day (BID) | ORAL | Status: AC
  Administered 2017-01-15 – 2017-01-16 (×2): 400 mg via ORAL
  Filled 2017-01-15 (×2): qty 1

## 2017-01-15 MED ORDER — ESZOPICLONE 2 MG PO TABS
2.0000 mg | ORAL_TABLET | Freq: Every day | ORAL | Status: DC
  Administered 2017-01-15 – 2017-01-17 (×3): 2 mg via ORAL
  Filled 2017-01-15 (×3): qty 1

## 2017-01-15 NOTE — Progress Notes (Signed)
[facility] MD Progress Note  01/15/2017 12:19 PM Rita Lewis  MRN:  [id number] Subjective:  Patient states " I slept better last night.'  Objective:Patient seen and chart reviewed.Discussed patient with treatment team.  Pt today seen in bed, reports she is feeling better. She was able to sleep better last night on the lunesta , but still needed the vistaril. She wants her dose to be increased if possible tonight. Per staff , pt is attending groups , denies any ADRs to medications.     Principal Problem: Bipolar I disorder, most recent episode depressed (HCC) Diagnosis:   Patient Active Problem List   Diagnosis Date Noted  . Bipolar I disorder, most recent episode depressed (HCC) [F31.30] 01/14/2017  . PTSD (post-traumatic stress disorder) [F43.10] 01/14/2017  . Panic disorder [F41.0] 01/14/2017   Total Time spent with patient: 20 minutes  Past Psychiatric History: Please see H&P.   Past Medical History:  Past Medical History:  Diagnosis Date  . Anxiety   . Bipolar 2 disorder (HCC)   . Borderline personality disorder   . Cytomegalovirus (CMV) viremia (HCC)   . Fibromyalgia   . Hypertension   . Manic depression (HCC)   . Migraines   . Psychosis     Past Surgical History:  Procedure Laterality Date  . ABDOMINAL HYSTERECTOMY    . TUBAL LIGATION     Family History:  Family History  Problem Relation Age of Onset  . Hypertension Other   . Diabetes Other   . Stroke Other    Family Psychiatric  History: Please see H&P.  Social History:  History  Alcohol Use No     History  Drug Use No    Social History   Social History  . Marital status: Single    Spouse name: N/A  . Number of children: N/A  . Years of education: N/A   Social History Main Topics  . Smoking status: Current Every Day Smoker    Packs/day: 1.00    Types: Cigarettes  . Smokeless tobacco: None  . Alcohol use No  . Drug use: No  . Sexual activity: Not Asked   Other Topics Concern  . None    Social History Narrative  . None   Additional Social History:                         Sleep: improving  Appetite:  Fair  Current Medications: Current Facility-Administered Medications  Medication Dose Route Frequency Provider Last Rate Last Dose  . alum & mag hydroxide-simeth (MAALOX/MYLANTA) 200-200-20 MG/5ML suspension 30 mL  30 mL Oral Q4H PRN Jackelyn Poling, NP      . ARIPiprazole (ABILIFY) tablet 5 mg  5 mg Oral Daily Craige Cotta, MD   5 mg at 01/15/17 0919  . cyclobenzaprine (FLEXERIL) tablet 5 mg  5 mg Oral TID PRN Jomarie Longs, MD   5 mg at 01/14/17 1205  . escitalopram (LEXAPRO) tablet 5 mg  5 mg Oral Daily Rockey Situ Cobos, MD   5 mg at 01/15/17 0920  . eszopiclone (LUNESTA) tablet 1 mg  1 mg Oral QHS Rockey Situ Cobos, MD   1 mg at 01/14/17 2131  . hydrOXYzine (ATARAX/VISTARIL) tablet 25 mg  25 mg Oral TID PRN Jackelyn Poling, NP   25 mg at 01/14/17 2306  . ibuprofen (ADVIL,MOTRIN) tablet 400 mg  400 mg Oral BID PRN Jomarie Longs, MD      . ibuprofen (ADVIL,MOTRIN) tablet  400 mg  400 mg Oral BID Jomarie Longs, MD   400 mg at 01/15/17 0920  . LORazepam (ATIVAN) tablet 0.5 mg  0.5 mg Oral Q6H PRN Craige Cotta, MD   0.5 mg at 01/14/17 1205  . magnesium hydroxide (MILK OF MAGNESIA) suspension 30 mL  30 mL Oral Daily PRN Jackelyn Poling, NP   30 mL at 01/13/17 0347  . menthol-cetylpyridinium (CEPACOL) lozenge 3 mg  1 lozenge Oral QID Jomarie Longs, MD   3 mg at 01/14/17 1559  . nicotine (NICODERM CQ - dosed in mg/24 hours) patch 21 mg  21 mg Transdermal Daily Craige Cotta, MD   21 mg at 01/15/17 1610    Lab Results:  Results for orders placed or performed during the hospital encounter of 01/12/17 (from the past 48 hour(s))  Hemoglobin A1c     Status: None   Collection Time: 01/13/17  6:17 PM  Result Value Ref Range   Hgb A1c MFr Bld 4.9 4.8 - 5.6 %    Comment: (NOTE)         Pre-diabetes: 5.7 - 6.4         Diabetes: >6.4         Glycemic control for  adults with diabetes: <7.0    Mean Plasma Glucose 94 mg/dL    Comment: (NOTE) Performed At: [facility] [address] [place], [place] [id number] Mila Homer MD [id number] Performed at [facility], [address] [place], [place] [address]   TSH     Status: None   Collection Time: 01/14/17  6:22 AM  Result Value Ref Range   TSH 2.569 0.350 - 4.500 uIU/mL    Comment: Performed by a 3rd Generation assay with a functional sensitivity of <=0.01 uIU/mL. Performed at [facility], [address], [place], [place] [address]   Lipid panel     Status: Abnormal   Collection Time: 01/14/17  6:22 AM  Result Value Ref Range   Cholesterol 221 (H) 0 - 200 mg/dL   Triglycerides 962 (H) <150 mg/dL   HDL 49 >95 mg/dL   Total CHOL/HDL Ratio 4.5 RATIO   VLDL 37 0 - 40 mg/dL   LDL Cholesterol 284 (H) 0 - 99 mg/dL    Comment:        Total Cholesterol/HDL:CHD Risk Coronary Heart Disease Risk Table                     Men   Women  1/2 Average Risk   3.4   3.3  Average Risk       5.0   4.4  2 X Average Risk   9.6   7.1  3 X Average Risk  23.4   11.0        Use the calculated Patient Ratio above and the CHD Risk Table to determine the patient's CHD Risk.        ATP III CLASSIFICATION (LDL):  <100     mg/dL   Optimal  132-440  mg/dL   Near or Above                    Optimal  130-159  mg/dL   Borderline  102-725  mg/dL   High  >366     mg/dL   Very High Performed at [facility], [address] [place], [place] [address]     Blood Alcohol level:  No results found  for: [facility]  Metabolic Disorder Labs: Lab Results  Component Value Date   HGBA1C 4.9 01/13/2017   MPG 94 01/13/2017   No results found for: PROLACTIN Lab Results  Component Value Date   CHOL 221 (H) 01/14/2017   TRIG 184 (H) 01/14/2017   HDL 49 01/14/2017   CHOLHDL 4.5 01/14/2017   VLDL 37 01/14/2017   LDLCALC 135 (H) 01/14/2017   LDLCALC 127 (H)  01/13/2017    Physical Findings: AIMS:  , ,  ,  ,    CIWA:    COWS:     Musculoskeletal: Strength & Muscle Tone: within normal limits Gait & Station: normal Patient leans: N/A  Psychiatric Specialty Exam: Physical Exam  Nursing note and vitals reviewed.   Review of Systems  Psychiatric/Behavioral: Positive for depression. The patient is nervous/anxious.   All other systems reviewed and are negative.   Blood pressure 134/84, pulse (!) 134, temperature 97.8 F (36.6 C), temperature source Oral, resp. rate 16, height  (1.676 m), weight 71.7 kg (158 lb), SpO2 99 %.Body mass index is 25.5 kg/m.  General Appearance: Casual  Eye Contact:  Fair  Speech:  Normal Rate  Volume:  Decreased  Mood:  Anxious and Depressed  Affect:  Congruent  Thought Process:  Goal Directed and Descriptions of Associations: Intact  Orientation:  Full (Time, Place, and Person)  Thought Content:  Rumination  Suicidal Thoughts:  No  Homicidal Thoughts:  No  Memory:  Immediate;   Fair Recent;   Fair Remote;   Fair  Judgement:  Fair  Insight:  Fair  Psychomotor Activity:  Normal  Concentration:  Concentration: Fair and Attention Span: Fair  Recall:  [employer] of Knowledge:  Fair  Language:  Fair  Akathisia:  No  Handed:  Right  AIMS (if indicated):     Assets:  Communication Skills Desire for Improvement  ADL's:  Intact  Cognition:  WNL  Sleep:  Number of Hours: 5.75   Bipolar I disorder, most recent episode depressed (HCC) unstable - improving  Will continue today 01/15/17 plan as below except where it is noted.    Treatment Plan Summary: Patient seen as improving, has improved sleep which she was struggling with , will continue to make medication readjustments.  Daily contact with patient to assess and evaluate symptoms and progress in treatment, Medication management and Plan see below  Will continue Lexapro 5 mg po daily for affective sx. Will continue Abilify 5 mg po daily for  mood lability. Will increase Lunesta to 2 mg po qhs for sleep issues. Motrin 400 mg po bid for inflammation.  Patient will benefit form referral to CBT for trauma. Lipid panel - abnormal - will recommend dietary management. EKG REVIEWED  - qtc wnl, sinuc tachycardia , NSR. CSW will continue to work on disposition.  Jaylon Grode, MD 01/15/2017, 12:19 PM

## 2017-01-15 NOTE — B[facility] Suicide Risk Assessment (Signed)
[facility] INPATIENT:  Family/Significant Other Suicide Prevention Education  Suicide Prevention Education:  Patient Refusal for Family/Significant Other Suicide Prevention Education: The patient Rita Lewis has refused to provide written consent for family/significant other to be provided Family/Significant Other Suicide Prevention Education during admission and/or prior to discharge.  Physician notified.  Writer provided suicide prevention education directly to patient; conversation included risk factors, warning signs and resources to contact for help. Mobile crisis services explained and  explanations given as to resources which will be included on patient's discharge paperwork.   Carney Bern 01/15/2017, 3:39 PM

## 2017-01-15 NOTE — Progress Notes (Signed)
Rita Lewis is starting to process her feelings ( anger, hurt, disappointment) and this was evidenced today in [employer] when she shared her feelings with the group. She is quiet, isolative , sullen and remains emotionallly stuck. A She was gvien prn doses of FLexeril and Ativan ( for c/o pain in her face and anxiety), both that she stated she got relief. SHe completed her daily assessment and on this she wrote she denied SI today and she rated her depression, hopelessness and axnetiy.R Safety is in place.

## 2017-01-15 NOTE — BHH Group Notes (Signed)
Goals Group   Date:  01/15/2017  Time:  1300  Type of Therapy:  Nurse Education  :  Goals Group The group is focused on teaching patients how to set attainable goals that will help them achieve their recovery.   Participation Level:  Active  Participation Quality:  Attentive  Affect:  Flat  Cognitive:  Alert  Insight:  Improving  Engagement in Group:  Improving  Modes of Intervention:  Education  Summary of Progress/Problems:  Rita Lewis 01/15/2017, 5:24 PM

## 2017-01-15 NOTE — [facility] Group Notes (Signed)
[facility] LCSW Group Therapy  01/15/2017   10 AM  Type of Therapy:  Group Therapy ~ Supports following Discharge  Participation Level:  Did Not Attend; invited to participate yet did not despite overhead announcement and encouragement by staff  Catherine C Harrill, LCSW 

## 2017-01-16 ENCOUNTER — Other Ambulatory Visit (HOSPITAL_COMMUNITY): Payer: Self-pay

## 2017-01-16 ENCOUNTER — Encounter (HOSPITAL_COMMUNITY): Payer: Self-pay

## 2017-01-16 DIAGNOSIS — F3130 Bipolar disorder, current episode depressed, mild or moderate severity, unspecified: Secondary | ICD-10-CM

## 2017-01-16 LAB — COMPREHENSIVE METABOLIC PANEL
ALT: 11 U/L — ABNORMAL LOW (ref 14–54)
ANION GAP: 7 (ref 5–15)
AST: 15 U/L (ref 15–41)
Albumin: 4.6 g/dL (ref 3.5–5.0)
Alkaline Phosphatase: 44 U/L (ref 38–126)
BUN: 20 mg/dL (ref 6–20)
CALCIUM: 9.2 mg/dL (ref 8.9–10.3)
CHLORIDE: 103 mmol/L (ref 101–111)
CO2: 28 mmol/L (ref 22–32)
Creatinine, Ser: 0.83 mg/dL (ref 0.44–1.00)
Glucose, Bld: 65 mg/dL (ref 65–99)
Potassium: 4 mmol/L (ref 3.5–5.1)
Sodium: 138 mmol/L (ref 135–145)
Total Bilirubin: 0.4 mg/dL (ref 0.3–1.2)
Total Protein: 7.9 g/dL (ref 6.5–8.1)

## 2017-01-16 LAB — PROLACTIN: PROLACTIN: 24.3 ng/mL — AB (ref 4.8–23.3)

## 2017-01-16 LAB — CBC
HCT: 40.9 % (ref 36.0–46.0)
HEMOGLOBIN: 14.1 g/dL (ref 12.0–15.0)
MCH: 29.6 pg (ref 26.0–34.0)
MCHC: 34.5 g/dL (ref 30.0–36.0)
MCV: 85.7 fL (ref 78.0–100.0)
Platelets: 273 10*3/uL (ref 150–400)
RBC: 4.77 MIL/uL (ref 3.87–5.11)
RDW: 11.5 % (ref 11.5–15.5)
WBC: 6.9 10*3/uL (ref 4.0–10.5)

## 2017-01-16 MED ORDER — METOPROLOL TARTRATE 12.5 MG HALF TABLET
12.5000 mg | ORAL_TABLET | Freq: Two times a day (BID) | ORAL | Status: DC
  Filled 2017-01-16 (×2): qty 1

## 2017-01-16 MED ORDER — ONDANSETRON 4 MG PO TBDP
ORAL_TABLET | ORAL | Status: AC
  Administered 2017-01-16: 4 mg via ORAL
  Filled 2017-01-16: qty 1

## 2017-01-16 MED ORDER — ONDANSETRON 4 MG PO TBDP
4.0000 mg | ORAL_TABLET | Freq: Four times a day (QID) | ORAL | Status: DC | PRN
  Administered 2017-01-16 – 2017-01-18 (×2): 4 mg via ORAL
  Filled 2017-01-16: qty 1

## 2017-01-16 MED ORDER — PROPRANOLOL HCL 10 MG PO TABS
ORAL_TABLET | ORAL | Status: AC
  Administered 2017-01-16: 10 mg via ORAL
  Filled 2017-01-16: qty 1

## 2017-01-16 MED ORDER — ARIPIPRAZOLE 2 MG PO TABS
2.0000 mg | ORAL_TABLET | Freq: Every day | ORAL | Status: DC
  Administered 2017-01-17 – 2017-01-18 (×2): 2 mg via ORAL
  Filled 2017-01-16 (×6): qty 1

## 2017-01-16 MED ORDER — PANTOPRAZOLE SODIUM 40 MG PO TBEC
40.0000 mg | DELAYED_RELEASE_TABLET | Freq: Every day | ORAL | Status: DC
  Administered 2017-01-16 – 2017-01-18 (×3): 40 mg via ORAL
  Filled 2017-01-16 (×6): qty 1

## 2017-01-16 MED ORDER — METOPROLOL TARTRATE 25 MG PO TABS
ORAL_TABLET | ORAL | Status: AC
  Filled 2017-01-16: qty 1

## 2017-01-16 MED ORDER — PROPRANOLOL HCL 10 MG PO TABS
10.0000 mg | ORAL_TABLET | ORAL | Status: AC
  Administered 2017-01-16: 10 mg via ORAL

## 2017-01-16 NOTE — Progress Notes (Signed)
Nursing Note 01/16/2017 [id number]  Data Reports sleeping fair without PRN sleep med.  Rates depression 6/10, hopelessness 5/10, and anxiety 8/10. Affect blunted but appropriate. Denies HI, SI, AVH.  Attending groups, spends free time in day area, napped in afternoon.  Multiple somatic complaints throughout day- monitored for vomiting, tachycardia throughout day.  One observed vomit but none after patient received protonix/Zofran per NP (see note).  Pulse reduced in afternoon- IM consulted by MD new orders received.   Action Spoke with patient 1:1, nurse offered support to patient throughout shift.  Continued monitoring throughout day, MD, NP consulted multiple times per notes.  To get labs drawn tonight based on complaints.Continues to be monitored on 15 minute checks for safety.  Response Remains safe on unit.  No BM yet today- plan to evaluate tomorrow if no BM.  IM to follow up on tachycardia tomorrow- echo to be scheduled by day shift.

## 2017-01-16 NOTE — Progress Notes (Signed)
Patient at desk, pulse rechecked, lower at 114.  Patient still complains of weakness/feeling tired.  Patient mentioned for first time to this RN that she has vomited x4 since yesterday afternoon "every time I eat I throw up."  LBM 3 days ago per patient but she states "I don't feel constipated" and received MOM 2013 yesterday.  C/O abdominal cramping 8/10 RLQ to LLQ, tender on palpation, BS active x4, abdomen soft and nondistended.  NP notified received order for Zofran and Protonix.

## 2017-01-16 NOTE — [facility] Group Notes (Signed)
[facility] LCSW Group Therapy  01/16/2017 1:15pm  Type of Therapy:  Group Therapy vercoming Obstacles  Pt did not attend, declined invitation.    Labrisha Wuellner, LCSW 01/16/2017 2:42 PM  

## 2017-01-16 NOTE — Progress Notes (Addendum)
D: Pt at the time of assessment was alert and oriented x 4. Pt at the time endorsed moderate anxiety; states, "Being anxious is a normal thing for me. Many time I just anxious and even panicky for no reason at all."  Pt denied depression. Pt at the time also denied SI, pain, HI or AVH. Pt observed interacting with peers in the dayroom. A: Medications offered as prescribed. All patient's questions and concerns addressed. Support, encouragement, and safe environment provided. Will continue to monitor for any changes. 15-minute safety checks continue. R: Pt was med compliant. Pt attended wrap-up group. Safety checks continue.

## 2017-01-16 NOTE — Progress Notes (Addendum)
RN noted patient's pulse elevated per NOC vitals sheet (see flowsheet).  Patient assessed when nurse out of report- marked tachycardia (144) regular rhythm S1 S2, no signs distress, no syncope dyspnea palpitations or chest pain.  No numbness weakness or tingnling. No orthostasis (see flowsheet).  H/O going to ER multiple times for palpitations and chest pain, history of panic anxiety.  Pulse consistently high per flowsheet since admission 4/19.  Reports marked anxiety today.  Given PRN for anxiety, abilify and nicotine patch held- instructed to rest in room.  Attending MD notified, attending MD stated he will see patient first.  58 Addendum 930 MD consulted as no new orders observed.  Received order for one time dose of propranolol .  Awaiting pharmacy clearance.  Patient shows no signs distress.  Reports anxiety has decreased.

## 2017-01-16 NOTE — Progress Notes (Signed)
[facility]  Called by Dr Jama Flavors to discuss tachycardia in this 32 y/o patient. HR goes up to 160s at times. Thyroid functions are normal.  I have recommended blood work to look for anemia and dehydration. I will order an ECHO as well.   Calvert Cantor, MD

## 2017-01-16 NOTE — Progress Notes (Addendum)
[facility] MD Progress Note  01/16/2017 4:30 PM Niketa Turner  MRN:  [id number] Subjective:  Patient reports partial improvement but remains depressed, anxious. Sleep has improved partially. She reports ongoing sense of palpitations, which at times she feels trigger increased anxiety ( rather than vice-versa).  Denies medication side effects. Reports nausea, vomiting x 2  Today, but has been able to tolerate PO intake well.  At this time denies suicidal ideations . Objective: I have discussed case with treatment team and have met with patient . She is presenting with partial improvement compared to admission , but reports ongoing , chronic anxiety. At this time denies any suicidal ideations, and is future oriented, stating that she is thinking of living with family member in [place] area after discharge. Denies suicidal or self injurious ideations. At this time denies medication side effects. As noted, patient has presented tachycardic, which she states is ongoing for her, and occurring even prior to admission. Abilify may be a contributor to tachycardia, but she reports she had palpitations even prior to starting this medication. No disruptive behaviors on unit, going to some groups.     Principal Problem: Bipolar I disorder, most recent episode depressed ([place]) Diagnosis:   Patient Active Problem List   Diagnosis Date Noted  . Bipolar I disorder, most recent episode depressed ([place]) [F31.30] 01/14/2017  . PTSD (post-traumatic stress disorder) [F43.10] 01/14/2017  . Panic disorder [F41.0] 01/14/2017   Total Time spent with patient: 20 minutes  Past Psychiatric History: Please see H&P.   Past Medical History:  Past Medical History:  Diagnosis Date  . Anxiety   . Bipolar 2 disorder ([place])   . Borderline personality disorder   . Cytomegalovirus (CMV) viremia ([place])   . Fibromyalgia   . Hypertension   . Manic depression ([place])   . Migraines   . Psychosis     Past Surgical History:   Procedure Laterality Date  . ABDOMINAL HYSTERECTOMY    . TUBAL LIGATION     Family History:  Family History  Problem Relation Age of Onset  . Hypertension Other   . Diabetes Other   . Stroke Other    Family Psychiatric  History: Please see H&P.  Social History:  History  Alcohol Use No     History  Drug Use No    Social History   Social History  . Marital status: Single    Spouse name: N/A  . Number of children: N/A  . Years of education: N/A   Social History Main Topics  . Smoking status: Current Every Day Smoker    Packs/day: 1.00    Types: Cigarettes  . Smokeless tobacco: Never Used  . Alcohol use No  . Drug use: No  . Sexual activity: Not Asked   Other Topics Concern  . None   Social History Narrative  . None   Additional Social History:   Sleep: better than on admission, but states still not sleeping well   Appetite:  Fair  Current Medications: Current Facility-Administered Medications  Medication Dose Route Frequency Provider Last Rate Last Dose  . alum & mag hydroxide-simeth (MAALOX/MYLANTA) 200-200-20 MG/5ML suspension 30 mL  30 mL Oral Q4H PRN Rozetta Nunnery, NP      . ARIPiprazole (ABILIFY) tablet 5 mg  5 mg Oral Daily Myer Peer Valia Wingard, MD   5 mg at 01/16/17 1006  . cyclobenzaprine (FLEXERIL) tablet 5 mg  5 mg Oral TID PRN Ursula Alert, MD   5 mg at 01/16/17 1557  .  escitalopram (LEXAPRO) tablet 5 mg  5 mg Oral Daily Myer Peer Lafayette Dunlevy, MD   5 mg at 01/16/17 0810  . eszopiclone (LUNESTA) tablet 2 mg  2 mg Oral QHS Ursula Alert, MD   2 mg at 01/15/17 2152  . hydrOXYzine (ATARAX/VISTARIL) tablet 25 mg  25 mg Oral TID PRN Rozetta Nunnery, NP   25 mg at 01/16/17 1556  . ibuprofen (ADVIL,MOTRIN) tablet 400 mg  400 mg Oral BID PRN Ursula Alert, MD      . LORazepam (ATIVAN) tablet 0.5 mg  0.5 mg Oral Q6H PRN Jenne Campus, MD   0.5 mg at 01/16/17 0811  . magnesium hydroxide (MILK OF MAGNESIA) suspension 30 mL  30 mL Oral Daily PRN Rozetta Nunnery, NP    30 mL at 01/15/17 2013  . metoprolol tartrate (LOPRESSOR) tablet 12.5 mg  12.5 mg Oral BID Myer Peer Izabellah Dadisman, MD      . nicotine (NICODERM CQ - dosed in mg/24 hours) patch 21 mg  21 mg Transdermal Daily Jenne Campus, MD   21 mg at 01/16/17 1349  . ondansetron (ZOFRAN-ODT) disintegrating tablet 4 mg  4 mg Oral Q6H PRN Encarnacion Slates, NP   4 mg at 01/16/17 1345  . pantoprazole (PROTONIX) EC tablet 40 mg  40 mg Oral Daily Encarnacion Slates, NP   40 mg at 01/16/17 1347    Lab Results:  No results found for this or any previous visit (from the past 48 hour(s)).  Blood Alcohol level:  No results found for: [facility]  Metabolic Disorder Labs: Lab Results  Component Value Date   HGBA1C 4.9 01/13/2017   MPG 94 01/13/2017   Lab Results  Component Value Date   PROLACTIN 24.3 (H) 01/14/2017   Lab Results  Component Value Date   CHOL 221 (H) 01/14/2017   TRIG 184 (H) 01/14/2017   HDL 49 01/14/2017   CHOLHDL 4.5 01/14/2017   VLDL 37 01/14/2017   LDLCALC 135 (H) 01/14/2017   LDLCALC 127 (H) 01/13/2017    Physical Findings: AIMS: Facial and Oral Movements Muscles of Facial Expression: None, normal Lips and Perioral Area: None, normal Jaw: None, normal Tongue: None, normal,Extremity Movements Upper (arms, wrists, hands, fingers): None, normal Lower (legs, knees, ankles, toes): None, normal, Trunk Movements Neck, shoulders, hips: None, normal, Overall Severity Severity of abnormal movements (highest score from questions above): None, normal Incapacitation due to abnormal movements: None, normal Patient's awareness of abnormal movements (rate only patient's report): No Awareness, Dental Status Current problems with teeth and/or dentures?: No Does patient usually wear dentures?: No  CIWA:    COWS:     Musculoskeletal: Strength & Muscle Tone: within normal limits Gait & Station: normal Patient leans: N/A  Psychiatric Specialty Exam: Physical Exam  Nursing note and vitals reviewed.    Review of Systems  Psychiatric/Behavioral: Positive for depression. The patient is nervous/anxious.   All other systems reviewed and are negative. reports palpitations, denies chest pain, no shortness of breath, vomiting   Blood pressure 114/73, pulse (!) 114, temperature 97.9 F (36.6 C), temperature source Oral, resp. rate 16, height _0  (1.676 m), weight 71.7 kg (158 lb), SpO2 99 %.Body mass index is 25.5 kg/m.  General Appearance: improved grooming   Eye Contact:  Good  Speech:  Normal Rate  Volume:  Normal  Mood:  some improvement , less severely depressed, remains anxious   Affect:  more reactive, smiles briefly at times   Thought Process:  Linear  and Descriptions of Associations: Intact  Orientation:  Other:  fully alert and attentive   Thought Content:  somatic concerns, at this time denies hallucinations, and does not appear internally preoccupied   Suicidal Thoughts:  No currently denies any suicidal or self injurious ideations   Homicidal Thoughts:  No  Memory:  Recent and remote grossly intact   Judgement:  Other:  improving   Insight:  fair- improving   Psychomotor Activity:  Normal  Concentration:  Concentration: Good and Attention Span: Good  Recall:  Good  Fund of Knowledge:  Good  Language:  Good  Akathisia:  Negative  Handed:  Right  AIMS (if indicated):     Assets:  Desire for Improvement  ADL's:  Intact  Cognition:  WNL  Sleep:  Number of Hours: 4    Assessment - patient presents partially improved compared to admission - affect appears partially improved, less severely anxious, and more reactive . At this time denies suicidal ideations, and presents more future oriented. Of note, patient presents with persistent tachycardia, even when not having panic symptoms. She does report chronic anxiety, but states that at times it is the tachycardia itself that worsens anxiety, rather than the opposite. Tachycardia was present, as per patient, prior to Abilify trial,  but this medication could contribute, so will lower dose .     Treatment Plan Summary:  Treatment plan reviewed as below today 4/23.   Daily contact with patient to assess and evaluate symptoms and progress in treatment, Medication management and Plan see below  Continue  Lexapro 5 mg po daily for affective sx. Decrease  Abilify to 2  mg po daily for mood lability.- see rationale to decrease dose above  Continue  Lunesta to 2 mg po qhs for sleep issues. Motrin 400 mg po bid PRN for pain, inflammation CSW will continue to work on disposition. I have discussed case with hospitalist , recommended to start Metoprolol 12.5 mgrs BID to address tachycardia.   Jenne Campus, MD 01/16/2017, 4:30 PM   Patient ID: Arnoldo Hooker, female   DOB: 25-Dec-1985, 31 y.o.   MRN: [id number] Addendum- patient refused Metoprolol, states in the past this medication caused her to feel worse. Discussed with Dr. Wynelle Cleveland, Hospitalist, who advised to D/C order, and will make further recommendation once labs, studies she has ordered have been done . FCobos , MD

## 2017-01-16 NOTE — [facility] Group Notes (Signed)
[facility] LCSW Aftercare Discharge Planning Group Note   01/16/2017  8:45 AM  Participation Quality: Pt invited. Did not attend.  Jonathon Jordan, MSW, LCSWA 01/16/2017 10:20 AM

## 2017-01-16 NOTE — Progress Notes (Signed)
Adult Psychoeducational Group Note  Date:  01/16/2017 Time:  1:10 PM  Group Topic/Focus:  Wellness Toolbox:   The focus of this group is to discuss various aspects of wellness, balancing those aspects and exploring ways to increase the ability to experience wellness.  Patients will create a wellness toolbox for use upon discharge.  Participation Level:  Active  Participation Quality:  Appropriate  Affect:  Appropriate  Cognitive:  Alert  Insight: Improving  Engagement in Group:  Improving  Modes of Intervention:  Discussion  Additional Comments:  Patient participated in group this morning, she states that she is depressed and lost her kids, her home, her job and her car because she was charged with DWI (kids were in the car).  Pt states that she has always raised all her kids on her own every since they were born and now their dad will not allow her to see them.  Pt states that she feels nauseated every time she eats as well.   Rita Lewis 01/16/2017, 1:10 PM

## 2017-01-17 LAB — T4, FREE: FREE T4: 0.75 ng/dL (ref 0.61–1.12)

## 2017-01-17 MED ORDER — MAGNESIUM CITRATE PO SOLN
1.0000 | Freq: Once | ORAL | Status: AC
  Administered 2017-01-17: 1 via ORAL

## 2017-01-17 NOTE — Progress Notes (Signed)
Patient denies SI, HI and AVH but still reports increased anxiety.  Patient focused on discharge and planning care outside of the hospital.  Patient had somatic complaint of constipation and receive Magnesium Citrate.  No bowel movement reported.   Assess patient for safety, offer medications as prescribed, encourage patient to adequately hydrate to promote healthy bowel activity.    Continue to monitor as planned.

## 2017-01-17 NOTE — Progress Notes (Signed)
Patient denies SI, HI and AVH.  Patient reported decrease in anxiety and depression.  Patient has been compliant with medications and group therapies.  Patient calm and pleasant this shift.    Assess patient for safety, offer medications as prescribed, engage patient in 1:1 staff talks.   Continue to monitor patient as prescribed. Patient able to contract for safety.

## 2017-01-17 NOTE — Progress Notes (Signed)
Patient attended group and said that her day was a 8, but later changed her rating from 8 to a 4. She said she made the change because she did not have a bowel movement. The nurse was notified.

## 2017-01-17 NOTE — Progress Notes (Signed)
D: Pt was in her room upon initial approach.  Pt presents with anxious, depressed affect and mood.  She states "I'm fine" and reports her goal is to "figure out why I couldn't sleep last night."  Pt denies SI/HI, denies hallucinations, denies pain.  Pt has been visible in milieu interacting with peers and staff cautiously.  Pt attended evening group.    A: Introduced self to pt.  Actively listened to pt and offered support and encouragement. Medication administered per order.  PRN medication administered for anxiety.  Q15 minute safety checks maintained.  R: Pt is safe on the unit.  Pt is compliant with medications.  Pt verbally contracts for safety.  Will continue to monitor and assess.

## 2017-01-17 NOTE — Progress Notes (Signed)
[facility] Group Notes:  (Nursing/MHT/Case Management/Adjunct)  Date:  01/17/2017  Time:  11:47 PM  Type of Therapy:  Psychoeducational Skills  Participation Level:  Active  Participation Quality:  Appropriate  Affect:  Flat  Cognitive:  Appropriate  Insight:  Appropriate  Engagement in Group:  Developing/Improving  Modes of Intervention:  Education  Summary of Progress/Problems: The patient states that she had an "okay day" overall. She went on to say that she did not take a nap and that her roommate was discharged as well. As for the theme of the day, her recovery will include addressing her constipation and learning "how to forgive".   Hazle Coca S 01/17/2017, 11:47 PM

## 2017-01-17 NOTE — Plan of Care (Signed)
Problem: Safety: Goal: Periods of time without injury will increase Outcome: Progressing Pt has not harmed self or others tonight.  She denies SI/HI and verbally contracts for safety.    

## 2017-01-17 NOTE — [facility] Group Notes (Signed)

## 2017-01-17 NOTE — [facility] Group Notes (Signed)
[facility] [employer] Group Therapy 01/17/2017 1:15pm  Type of Therapy: [employer] Presentation  Participation Level: Active  Participation Quality: Attentive  Affect: Appropriate  Cognitive: Oriented  Insight: Developing/Improving  Engagement in Therapy: Engaged  Modes of Intervention: Discussion, Education and Socialization  Summary of Progress/Problems: [employer] ([employer]) Speaker came to talk about his personal journey with substance abuse and addiction. The pt processed ways by which to relate to the speaker. [employer] speaker provided handouts and educational information pertaining to groups and services offered by the M[employer]. Pt was engaged in speaker's presentation and was receptive to resources provided.    Josaiah Muhammed B. Tonnia Bardin, MSW, LCSWA 01/17/2017 2:38 PM   

## 2017-01-17 NOTE — Progress Notes (Addendum)
[facility] MD Progress Note  01/17/2017 3:47 PM Rita Lewis  MRN:  [id number] Subjective:  Patient states she is feeling " a little better ". Denies medication side effects. At this time she is feeling better than yesterday. We discussed history of palpitations, tachycardia further. States her tachycardia has been an ongoing issue for at least months, and not new. She states she is unsure of why it is occurring, but is clear in stating that her sense of tachycardia and palpitations trigger her anxiety attacks at times, rather than vice-versa. She states she is happy that a cardiac echo has been ordered , because she wants " to know if anything is wrong with my heart". Of note, denies chest pain or shortness of breath. She does , as above, state her mood is improving compared to admission and today is more focused on disposition planning and wanting to discharge soon. Denies medication side effects. Of note, complains of constipation x 3-4 days   Objective: I have discussed case with treatment team and have met with patient . She presents improved compared to admission and, as compared to yesterday, presents calmer, with a more reactive range of affect. Reports vague anxiety, but no panic symptoms at this time. As above, hospitalist consultant has ordered  echocardiogram which is scheduled for tomorrow in AM. Denies suicidal ideations. No disruptive or agitated behaviors on unit. We reviewed potential for Abilify to cause or contribute to tachycardia, but patient does not feel this is a factor and wants to continue this medication as she feels it is helping.     Principal Problem: Bipolar I disorder, most recent episode depressed ([place]) Diagnosis:   Patient Active Problem List   Diagnosis Date Noted  . Bipolar I disorder, most recent episode depressed ([place]) [F31.30] 01/14/2017  . PTSD (post-traumatic stress disorder) [F43.10] 01/14/2017  . Panic disorder [F41.0] 01/14/2017   Total Time spent with  patient: 20 minutes  Past Psychiatric History: Please see H&P.   Past Medical History:  Past Medical History:  Diagnosis Date  . Anxiety   . Bipolar 2 disorder ([place])   . Borderline personality disorder   . 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 Fibromyalgia   . Hypertension   . Manic depression ([place])   . Migraines   . Psychosis     Past Surgical History:  Procedure Laterality Date  . ABDOMINAL HYSTERECTOMY    . TUBAL LIGATION     Family History:  Family History  Problem Relation Age of Onset  . Hypertension Other   . Diabetes Other   . Stroke Other    Family Psychiatric  History: Please see H&P.  Social History:  History  Alcohol Use No     History  Drug Use No    Social History   Social History  . Marital status: Single    Spouse name: N/A  . Number of children: N/A  . Years of education: N/A   Social History Main Topics  . Smoking status: Current Every Day Smoker    Packs/day: 1.00    Types: Cigarettes  . Smokeless tobacco: Never Used  . Alcohol use No  . Drug use: No  . Sexual activity: Not Asked   Other Topics Concern  . None   Social History Narrative  . None   Additional Social History:   Sleep: improved   Appetite:  Good  Current Medications: Current Facility-Administered Medications  Medication Dose Route Frequency Provider Last Rate Last Dose  . alum &  mag hydroxide-simeth (MAALOX/MYLANTA) 200-200-20 MG/5ML suspension 30 mL  30 mL Oral Q4H PRN Rozetta Nunnery, NP      . ARIPiprazole (ABILIFY) tablet 2 mg  2 mg Oral Daily Jenne Campus, MD   2 mg at 01/17/17 0816  . cyclobenzaprine (FLEXERIL) tablet 5 mg  5 mg Oral TID PRN Ursula Alert, MD   5 mg at 01/16/17 1557  . escitalopram (LEXAPRO) tablet 5 mg  5 mg Oral Daily Myer Peer , MD   5 mg at 01/17/17 0813  . eszopiclone (LUNESTA) tablet 2 mg  2 mg Oral QHS Ursula Alert, MD   2 mg at 01/16/17 2115  . hydrOXYzine (ATARAX/VISTARIL) tablet 25 mg  25 mg Oral TID PRN Rozetta Nunnery, NP   25 mg at 01/17/17 0817  . ibuprofen (ADVIL,MOTRIN) tablet 400 mg  400 mg Oral BID PRN Ursula Alert, MD      . LORazepam (ATIVAN) tablet 0.5 mg  0.5 mg Oral Q6H PRN Jenne Campus, MD   0.5 mg at 01/17/17 1212  . magnesium hydroxide (MILK OF MAGNESIA) suspension 30 mL  30 mL Oral Daily PRN Rozetta Nunnery, NP   30 mL at 01/15/17 2013  . nicotine (NICODERM CQ - dosed in mg/24 hours) patch 21 mg  21 mg Transdermal Daily Jenne Campus, MD   21 mg at 01/17/17 0814  . ondansetron (ZOFRAN-ODT) disintegrating tablet 4 mg  4 mg Oral Q6H PRN Encarnacion Slates, NP   4 mg at 01/16/17 1345  . pantoprazole (PROTONIX) EC tablet 40 mg  40 mg Oral Daily Encarnacion Slates, NP   40 mg at 01/17/17 0813    Lab Results:  Results for orders placed or performed during the hospital encounter of 01/12/17 (from the past 48 hour(s))  Comprehensive metabolic panel     Status: Abnormal   Collection Time: 01/16/17  6:11 PM  Result Value Ref Range   Sodium 138 135 - 145 mmol/L   Potassium 4.0 3.5 - 5.1 mmol/L   Chloride 103 101 - 111 mmol/L   CO2 28 22 - 32 mmol/L   Glucose, Bld 65 65 - 99 mg/dL   BUN 20 6 - 20 mg/dL   Creatinine, Ser 0.83 0.44 - 1.00 mg/dL   Calcium 9.2 8.9 - 10.3 mg/dL   Total Protein 7.9 6.5 - 8.1 g/dL   Albumin 4.6 3.5 - 5.0 g/dL   AST 15 15 - 41 U/L   ALT 11 (L) 14 - 54 U/L   Alkaline Phosphatase 44 38 - 126 U/L   Total Bilirubin 0.4 0.3 - 1.2 mg/dL   GFR calc non Af Amer >60 >60 mL/min   GFR calc Af Amer >60 >60 mL/min    Comment: (NOTE) The eGFR has been calculated using the CKD EPI equation. This calculation has not been validated in all clinical situations. eGFR's persistently <60 mL/min signify possible Chronic Kidney Disease.    Anion gap 7 5 - 15    Comment: Performed at [facility], [place] [address] [place], [place] [address]  CBC     Status: None   Collection Time: 01/16/17  6:11 PM  Result Value Ref Range   WBC 6.9 4.0 - 10.5 K/uL   RBC 4.77 3.87  - 5.11 MIL/uL   Hemoglobin 14.1 12.0 - 15.0 g/dL   HCT 40.9 36.0 - 46.0 %   MCV 85.7 78.0 - 100.0 fL   MCH 29.6 26.0 - 34.0 pg  MCHC 34.5 30.0 - 36.0 g/dL   RDW 11.5 11.5 - 15.5 %   Platelets 273 150 - 400 K/uL    Comment: Performed at [facility], [place] [address], [place], [place] [address]  T4, free     Status: None   Collection Time: 01/16/17  6:11 PM  Result Value Ref Range   Free T4 0.75 0.61 - 1.12 ng/dL    Comment: (NOTE) Biotin ingestion may interfere with free T4 tests. If the results are inconsistent with the TSH level, previous test results, or the clinical presentation, then consider biotin interference. If needed, order repeat testing after stopping biotin. Performed at [facility], [place] [address], [place], [place] [address]     Blood Alcohol level:  No results found for: [facility]  Metabolic Disorder Labs: Lab Results  Component Value Date   HGBA1C 4.9 01/13/2017   MPG 94 01/13/2017   Lab Results  Component Value Date   PROLACTIN 24.3 (H) 01/14/2017   Lab Results  Component Value Date   CHOL 221 (H) 01/14/2017   TRIG 184 (H) 01/14/2017   HDL 49 01/14/2017   CHOLHDL 4.5 01/14/2017   VLDL 37 01/14/2017   LDLCALC 135 (H) 01/14/2017   LDLCALC 127 (H) 01/13/2017    Physical Findings: AIMS: Facial and Oral Movements Muscles of Facial Expression: None, normal Lips and Perioral Area: None, normal Jaw: None, normal Tongue: None, normal,Extremity Movements Upper (arms, wrists, hands, fingers): None, normal Lower (legs, knees, ankles, toes): None, normal, Trunk Movements Neck, shoulders, hips: None, normal, Overall Severity Severity of abnormal movements (highest score from questions above): None, normal Incapacitation due to abnormal movements: None, normal Patient's awareness of abnormal movements (rate only patient's report): No Awareness, Dental Status Current problems with teeth and/or dentures?: No Does patient usually wear  dentures?: No  CIWA:    COWS:     Musculoskeletal: Strength & Muscle Tone: within normal limits Gait & Station: normal Patient leans: N/A  Psychiatric Specialty Exam: Physical Exam  Nursing note and vitals reviewed.   Review of Systems  Psychiatric/Behavioral: Positive for depression. The patient is nervous/anxious.   All other systems reviewed and are negative. denies any chest pain or dyspnea. (+) constipation  Blood pressure 121/88, pulse (!) 117, temperature 98.4 F (36.9 C), resp. rate 18, height 5' 6" (1.676 m), weight 71.7 kg (158 lb), SpO2 99 %.Body mass index is 25.5 kg/m.  General Appearance: Well Groomed  Eye Contact:  Good  Speech:  Normal Rate  Volume:  Normal  Mood:  improving, less depressed   Affect:  more reactive today   Thought Process:  Goal Directed and Descriptions of Associations: Intact  Orientation:  Full (Time, Place, and Person)  Thought Content:  no hallucinations, no delusions, not internally preoccupied   Suicidal Thoughts:  No currently denies any suicidal or self injurious ideations   Homicidal Thoughts:  No  Memory:  Recent and remote grossly intact   Judgement:  Other:  improving   Insight:  improving  Psychomotor Activity:  Normal  Concentration:  Concentration: Good and Attention Span: Good  Recall:  Good  Fund of Knowledge:  Good  Language:  Good  Akathisia:  No  Handed:  Right  AIMS (if indicated):     Assets:  Communication Skills Desire for Improvement Resilience  ADL's:  Intact  Cognition:  WNL  Sleep:  Number of Hours: 4.75    Assessment - patient reports she is feeling better today, and does  present with improving mood and range of affect. She reports history of tachycardia, which she states has been present for months or years. She states no etiology has been identified. EKGs show NSR ( Sinus Tachycardia). Labs reviewed - unremarkable. Patient has echocardiogram as ordered by hospitalist scheduled for tomorrow. At this  time denies suicidal ideations, and is becoming more focused on discharging soon. .  Denies medication side effects and does not feel medications are contributing to tachycardia/palpitations at this time    Treatment Plan Summary:  Treatment plan reviewed as below today 4/24   Daily contact with patient to assess and evaluate symptoms and progress in treatment, Medication management and Plan see below  Continue  Lexapro 5 mg po daily for affective sx. Continue  Abilify  2  mg po daily for mood lability.- see rationale to decrease dose above  Continue  Lunesta to 2 mg po qhs for sleep issues. Continue Ativan 0.5 mgrs Q 6 hours PRN for anxiety as needed  Motrin 400 mg po bid PRN for pain, inflammation Treatment team continue to work on disposition planning Mag Citrate for constipation   Echocardiogram scheduled for tomorrow   Jenne Campus, MD 01/17/2017, 3:47 PM   Patient ID: Arnoldo Hooker, female   DOB: 01-17-1986, 31 y.o.   MRN: [id number]

## 2017-01-17 NOTE — Progress Notes (Signed)
Talked with Scheduler at [facility] for ECHO.  Echo scheduled on 4/25 @ 11 am patient to go to than [facility] long admissions and check in for ECHO appointment.

## 2017-01-18 ENCOUNTER — Ambulatory Visit (HOSPITAL_COMMUNITY)
Admission: RE | Admit: 2017-01-18 | Discharge: 2017-01-18 | Disposition: A | Discharge status: Home / Self Care | Payer: Medicaid Other | Source: Ambulatory Visit | Attending: Internal Medicine | Admitting: Internal Medicine

## 2017-01-18 DIAGNOSIS — R000 Tachycardia, unspecified: Secondary | ICD-10-CM

## 2017-01-18 LAB — ECHOCARDIOGRAM COMPLETE
AVLVOTPG: 5 mmHg
E/e' ratio: 6.74
EWDT: 172 ms
FS: 34 % (ref 28–44)
HEIGHTINCHES: 66 in
IVS/LV PW RATIO, ED: 1.1
LA diam index: 1.77 cm/m2
LA vol A4C: 18.6 ml
LA vol: 25.5 mL
LASIZE: 32 mm
LAVOLIN: 14.1 mL/m2
LEFT ATRIUM END SYS DIAM: 32 mm
LV PW d: 7.66 mm — AB (ref 0.6–1.1)
LV TDI E'LATERAL: 12.5
LV TDI E'MEDIAL: 8.49
LV e' LATERAL: 12.5 cm/s
LVEEAVG: 6.74
LVEEMED: 6.74
LVOT SV: 49 mL
LVOT VTI: 17.3 cm
LVOT area: 2.84 cm2
LVOT diameter: 19 mm
LVOT peak vel: 115 cm/s
Lateral S' vel: 11.5 cm/s
MV Dec: 172
MV pk A vel: 60.7 m/s
MVPG: 3 mmHg
MVPKEVEL: 84.3 m/s
TAPSE: 25.8 mm
Weight: 2528 oz

## 2017-01-18 MED ORDER — BISACODYL 10 MG RE SUPP
RECTAL | Status: AC
  Filled 2017-01-18: qty 1

## 2017-01-18 MED ORDER — ESCITALOPRAM OXALATE 5 MG PO TABS: 5.0000 mg | ORAL_TABLET | Freq: Every day | ORAL | 0 refills | Status: AC

## 2017-01-18 MED ORDER — NICOTINE 21 MG/24HR TD PT24: 21.0000 mg | MEDICATED_PATCH | Freq: Every day | TRANSDERMAL | 0 refills | Status: AC

## 2017-01-18 MED ORDER — ARIPIPRAZOLE 2 MG PO TABS: 2.0000 mg | ORAL_TABLET | Freq: Every day | ORAL | 0 refills | Status: AC

## 2017-01-18 MED ORDER — PANTOPRAZOLE SODIUM 40 MG PO TBEC: 40.0000 mg | DELAYED_RELEASE_TABLET | Freq: Every day | ORAL | 0 refills | Status: AC

## 2017-01-18 MED ORDER — ESZOPICLONE 2 MG PO TABS: 2.0000 mg | ORAL_TABLET | Freq: Every day | ORAL | 0 refills | Status: AC

## 2017-01-18 MED ORDER — HYDROXYZINE HCL 25 MG PO TABS: 25.0000 mg | ORAL_TABLET | Freq: Three times a day (TID) | ORAL | 0 refills | Status: AC | PRN

## 2017-01-18 MED ORDER — BISACODYL 10 MG RE SUPP
10.0000 mg | Freq: Once | RECTAL | Status: AC
  Administered 2017-01-18: 10 mg via RECTAL
  Filled 2017-01-18: qty 1

## 2017-01-18 NOTE — Progress Notes (Signed)
Recreation Therapy Notes  Date: 01/18/17 Time: 0930 Location: 400 Hall Dayroom  Group Topic: Stress Management  Goal Area(s) Addresses:  Patient will verbalize importance of using healthy stress management.  Patient will identify positive emotions associated with healthy stress management.   Behavioral Response: Engaged  Intervention: Stress Management  Activity :  Meditation.  LRT introduced the stress management technique of meditation.  LRT played a meditation focused on resilience from the Calm app to patients to fully participate in the activity.  Patients were to follow along as the meditation played to engage in the technique.  Education:  Stress Management, Discharge Planning.   Education Outcome: Acknowledges edcuation/In group clarification offered/Needs additional education  Clinical Observations/Feedback: Pt attended group.   Caroll Rancher, LRT/CTRS         Caroll Rancher A 01/18/2017 11:37 AM

## 2017-01-18 NOTE — Discharge Summary (Signed)
Physician Discharge Summary Note  Patient:  Rita Lewis is an 31 y.o., female MRN:  [id number] DOB:  31-Dec-1985 Patient phone:  [phone] (home)  Patient address:   [address] [place] [address],  Total Time spent with patient: 30 minutes  Date of Admission:  01/12/2017 Date of Discharge: 01/18/2017  Reason for Admission:  Anxiety disorder  Principal Problem: Bipolar I disorder, most recent episode depressed [facility]) Discharge Diagnoses: Patient Active Problem List   Diagnosis Date Noted  . Bipolar I disorder, most recent episode depressed ([facility]) [F31.30] 01/14/2017  . PTSD (post-traumatic stress disorder) [F43.10] 01/14/2017  . Panic disorder [F41.0] 01/14/2017    Past Psychiatric History:  See HPI  Past Medical History:  Past Medical History:  Diagnosis Date  . Anxiety   . Bipolar 2 disorder ([facility])   . Borderline personality disorder   . Cytomegalovirus (CMV) viremia ([facility])   . Fibromyalgia   . Hypertension   . Manic depression ([facility])   . Migraines   . Psychosis     Past Surgical History:  Procedure Laterality Date  . ABDOMINAL HYSTERECTOMY    . TUBAL LIGATION     Family History:  Family History  Problem Relation Age of Onset  . Hypertension Other   . Diabetes Other   . Stroke Other    Family Psychiatric  History:  See HPI Social History:  History  Alcohol Use No     History  Drug Use No    Social History   Social History  . Marital status: Single    Spouse name: N/A  . Number of children: N/A  . Years of education: N/A   Social History Main Topics  . Smoking status: Current Every Day Smoker    Packs/day: 1.00    Types: Cigarettes  . Smokeless tobacco: Never Used  . Alcohol use No  . Drug use: No  . Sexual activity: Not Asked   Other Topics Concern  . None   Social History Narrative  . None    Hospital Course:  Rita Lewis, 31 year old female, initially triaged at [facility] for occipital, neck pain pressure. Description  suggests panic attacks.  During her ED assessment , she states " they asked me if I was suicidal and I said " maybe." Does endorse depression, feeling " empty", and passive thoughts of death, suicide, but no actual plan or intention.  Rita Lewis was admitted for Bipolar I disorder, most recent episode depressed ([facility]) and crisis management.  Patient was treated with medications with their indications listed below in detail under Medication List.  Medical problems were identified and treated as needed.  Home medications were restarted as appropriate.  Improvement was monitored by observation and Rita Lewis daily report of symptom reduction.  Emotional and mental status was monitored by daily self inventory reports completed by Rita Lewis and clinical staff.  Patient reported continued improvement, denied any new concerns.  Patient had been compliant on medications and denied side effects.  Support and encouragement was provided.    Rita Lewis was evaluated by the treatment team for stability and plans for continued recovery upon discharge.  Patient was offered further treatment options upon discharge including Residential, Intensive Outpatient and Outpatient treatment. Patient will follow up with agency listed below for medication management and counseling.  Encouraged patient to maintain satisfactory support network and home environment.  Advised to adhere to medication compliance and outpatient treatment follow up.  Prescriptions provided.  Rita Lewis motivation was an integral factor for scheduling further treatment.  Employment, transportation, bed availability, health status, family support, and any pending legal issues were also considered during patient's hospital stay.  Upon completion of this admission the patient was both mentally and medically stable for discharge denying suicidal/homicidal ideation, auditory/visual/tactile hallucinations, delusional thoughts and paranoia.       Physical Findings: AIMS: Facial and Oral Movements Muscles of Facial Expression: None, normal Lips and Perioral Area: None, normal Jaw: None, normal Tongue: None, normal,Extremity Movements Upper (arms, wrists, hands, fingers): None, normal Lower (legs, knees, ankles, toes): None, normal, Trunk Movements Neck, shoulders, hips: None, normal, Overall Severity Severity of abnormal movements (highest score from questions above): None, normal Incapacitation due to abnormal movements: None, normal Patient's awareness of abnormal movements (rate only patient's report): No Awareness, Dental Status Current problems with teeth and/or dentures?: No Does patient usually wear dentures?: No  CIWA:    COWS:     Musculoskeletal: Strength & Muscle Tone: within normal limits Gait & Station: normal Patient leans: N/A  Psychiatric Specialty Exam:  See MD SRA Physical Exam  Nursing note and vitals reviewed.   ROS  Blood pressure 128/76, pulse (!) 124, temperature 98.5 F (36.9 C), temperature source Oral, resp. rate 18, height  (1.676 m), weight 71.7 kg (158 lb), SpO2 99 %.Body mass index is 25.5 kg/m.    Have you used any form of tobacco in the last 30 days? (Cigarettes, Smokeless Tobacco, Cigars, and/or Pipes): Yes  Has this patient used any form of tobacco in the last 30 days? (Cigarettes, Smokeless Tobacco, Cigars, and/or Pipes) Yes, N/A  Blood Alcohol level:  No results found for: [facility]  Metabolic Disorder Labs:  Lab Results  Component Value Date   HGBA1C 4.9 01/13/2017   MPG 94 01/13/2017   Lab Results  Component Value Date   PROLACTIN 24.3 (H) 01/14/2017   Lab Results  Component Value Date   CHOL 221 (H) 01/14/2017   TRIG 184 (H) 01/14/2017   HDL 49 01/14/2017   CHOLHDL 4.5 01/14/2017   VLDL 37 01/14/2017   LDLCALC 135 (H) 01/14/2017   LDLCALC 127 (H) 01/13/2017    See Psychiatric Specialty Exam and Suicide Risk Assessment completed by Attending Physician prior to  discharge.  Discharge destination:  Home  Is patient on multiple antipsychotic therapies at discharge:  No   Has Patient had three or more failed trials of antipsychotic monotherapy by history:  No  Recommended Plan for Multiple Antipsychotic Therapies: NA   Allergies as of 01/18/2017      Reactions   Acetaminophen Swelling   Gabapentin Swelling   Other reaction(s): Unknown   Lyrica [pregabalin] Swelling   Solu-medrol [methylprednisolone] Swelling   Shortness of breath   Topamax [topiramate] Swelling   Latex Rash      Medication List    TAKE these medications     Indication  ARIPiprazole 2 MG tablet Commonly known as:  ABILIFY Take 1 tablet (2 mg total) by mouth daily. Start taking on:  01/19/2017  Indication:  mood stabilization   escitalopram 5 MG tablet Commonly known as:  LEXAPRO Take 1 tablet (5 mg total) by mouth daily. Start taking on:  01/19/2017  Indication:  Major Depressive Disorder   eszopiclone 2 MG Tabs tablet Commonly known as:  LUNESTA Take 1 tablet (2 mg total) by mouth at bedtime. Take immediately before bedtime  Indication:  Trouble Sleeping   hydrOXYzine 25 MG tablet Commonly known as:  ATARAX/VISTARIL Take 1 tablet (25 mg total) by mouth 3 (three) times daily as needed for anxiety.  Indication:  Anxiety Neurosis   nicotine 21 mg/24hr patch Commonly known as:  NICODERM CQ - dosed in mg/24 hours Place 1 patch (21 mg total) onto the skin daily. Start taking on:  01/19/2017  Indication:  Nicotine Addiction   pantoprazole 40 MG tablet Commonly known as:  PROTONIX Take 1 tablet (40 mg total) by mouth daily. Start taking on:  01/19/2017  Indication:  Gastroesophageal Reflux Disease      Follow-up Information    [facility] Go on 01/19/2017.   Why:  Hospital follow-up appointment at 12:15PM. Please bring your insurance card, photo ID, and proof of income to the appointment. Contact information: [address] [place]  [address] [phone]          Follow-up recommendations:  Activity:  as tol Diet:  as tol  Comments:  1.  Take all your medications as prescribed.   2.  Report any adverse side effects to outpatient provider. 3.  Patient instructed to not use alcohol or illegal drugs while on prescription medicines. 4.  In the event of worsening symptoms, instructed patient to call 911, the crisis hotline or go to nearest emergency room for evaluation of symptoms.  Signed: Lindwood Qua, NP [facility] 01/18/2017, 1:45 PM

## 2017-01-18 NOTE — Progress Notes (Signed)
Patient ID: Rita Lewis, female   DOB: 06/10/1986, 31 y.o.   MRN: [id number]   Patient denies SI, HI and AVH upon discharge.  Patient stated "I have been able to open up here.  I never have been able to do that before."  I feel like I am ready to move forward when I discharge.    Patient affect bright and pleasant.  Patient capable of continuing treatment in a community setting.

## 2017-01-18 NOTE — BHH Group Notes (Signed)
[facility] LCSW Aftercare Discharge Planning Group Note   01/18/2017  8:45 AM  Participation Quality: Pt invited. Did not attend.  Jonathon Jordan, MSW, LCSWA 01/18/2017 12:02 PM

## 2017-01-18 NOTE — Progress Notes (Signed)
  Echocardiogram 2D Echocardiogram has been performed.  [name] 01/18/2017, 11:55 AM

## 2017-01-18 NOTE — Progress Notes (Signed)
Pt complained of nausea and indigestion earlier tonight.  PRN medication administered for nausea and indigestion.  She also continued to report discomfort related to constipation.  On-site provider notified.  Dulcolax 10 mg suppository X1 ordered and pt administered herself.  She reported she had a bowel movement afterwards.  Denies further needs and concerns at this time.  Will continue to monitor and assess.

## 2017-01-18 NOTE — Progress Notes (Signed)
D: Pt was in the dayroom upon initial approach.  Pt presents with anxious affect and mood.  She reports she is "unhappy" because "I have not been able to use the bathroom for 3 days straight."  Pt received X1 medication for constipation on day shift.  Her goal is to "take a shit."  Pt reports she is discharging tomorrow and she feels safe to do so.  She reports she plans to "start taking therapy again" and "my mother wants to be there now, so I guess that's good."  Pt denies SI/HI, denies hallucinations, denies pain.  Pt has been visible in milieu interacting with peers and staff appropriately.  She has been observed smiling and joking with peers and staff at times tonight.  Pt attended evening group.    A: Actively listened to pt and offered support and encouragement. Medications administered per order.  PRN medication administered for anxiety.  Prune juice given to pt, PO fluids encouraged and provided. Q15 minute safety checks maintained.  R: Pt is safe on the unit.  Pt is compliant with medications.  Pt verbally contracts for safety.  Will continue to monitor and assess.

## 2017-01-18 NOTE — [facility] Suicide Risk Assessment (Signed)
[facility] Discharge Suicide Risk Assessment   Principal Problem: Bipolar I disorder, most recent episode depressed [facility]) Discharge Diagnoses:  Patient Active Problem List   Diagnosis Date Noted  . Bipolar I disorder, most recent episode depressed (HCC) [F31.30] 01/14/2017  . PTSD (post-traumatic stress disorder) [F43.10] 01/14/2017  . Panic disorder [F41.0] 01/14/2017    Total Time spent with patient: 30 minutes  Musculoskeletal: Strength & Muscle Tone: within normal limits Gait & Station: normal Patient leans: N/A  Psychiatric Specialty Exam: ROS no headache, no chest pain, history of tachycardia, palpitations   Blood pressure 128/76, pulse (!) 124, temperature 98.5 F (36.9 C), temperature source Oral, resp. rate 18, height  (1.676 m), weight 71.7 kg (158 lb), SpO2 99 %.Body mass index is 25.5 kg/m.  General Appearance: Well Groomed  Eye Contact::  Good  Speech:  Normal Rate409  Volume:  Normal  Mood:  improved, states she is feeling better  Affect:  presents improved, less anxious,more reactive   Thought Process:  Linear and Descriptions of Associations: Intact  Orientation:  Full (Time, Place, and Person)  Thought Content:  denies hallucinations, no delusions, not internally preoccupied   Suicidal Thoughts:  No at this time denies any suicidal or self injurious ideations, denies any homicidal or violent ideations  Homicidal Thoughts:  No  Memory:  recent and remote grossly intact   Judgement:  Other:  improving  Insight:  improving   Psychomotor Activity:  Normal  Concentration:  Good  Recall:  Good  Fund of Knowledge:Good  Language: Good  Akathisia:  Negative  Handed:  Right  AIMS (if indicated):     Assets:  Desire for Improvement Resilience  Sleep:  Number of Hours: 4  Cognition: WNL  ADL's:  Intact   Mental Status Per Nursing Assessment::   On Admission:     Demographic Factors:  31 year old female   Loss Factors: Unemployment , her children are  currently living with their fathers .   Historical Factors: History of depression, history of prior psychiatric admissions, history of panic attacks   Risk Reduction Factors:   Sense of responsibility to family and Positive coping skills or problem solving skills  Continued Clinical Symptoms:  Patient presents improved compared to her admission presentation. Fully alert, attentive, well groomed, mood improved , affect fuller in range, less anxious, no thought disorder, no SI or HI, no psychotic symptoms. Future oriented- at this time plans to return to her home in [place], but states she is planning on relocating to [place], [place], to be closer to family . No disruptive or agitated behaviors on unit. At this time denies medication side effects. History of tachycardia, palpitations, states she feels they have decreased since admission. No current panic symptoms, presents calm. * While in hospital was worked up due to reports of persistent tachycardia. CBC WNL- no anemia, TSH/ T4  WNL, BMP unremarkable, EKG - sinus tachycardia. Echocardiogram done earlier today- result pending .  * Echocardiogram reported as normal    Cognitive Features That Contribute To Risk:  No gross cognitive deficits noted upon discharge. Is alert , attentive, and oriented x 3   Suicide Risk:  Mild:  Suicidal ideation of limited frequency, intensity, duration, and specificity.  There are no identifiable plans, no associated intent, mild dysphoria and related symptoms, good self-control (both objective and subjective assessment), few other risk factors, and identifiable protective factors, including available and accessible social support.  Follow-up Information    [facility] [employer]. Go  on 01/19/2017.   Why:  Hospital follow-up appointment at 12:15PM. Please bring your insurance card, photo ID, and proof of income to the appointment. Contact information: [address] [place]  [address] [phone]           Plan Of Care/Follow-up recommendations:  Activity:  as tolerated  Diet:  Regular- advised to avoid caffeine products due to history of tachycardia Tests:  NA Other:  See below Patient is requesting discharge and there are no current grounds for involuntary commitment  She is leaving unit in good spirits  Plans to follow up as above  She has an established PCP [facility], in [place]) whom she plans to follow up with regarding history of tachycardia. Craige Cotta, MD 01/18/2017, 1:23 PM

## 2017-01-18 NOTE — Tx Team (Signed)
Interdisciplinary Treatment and Diagnostic Plan Update 01/18/2017 Time of Session: 9:30am  Rita Lewis  MRN: [id number]  Principal Diagnosis: Bipolar Disorder , Depressed,  Panic Disorder   Secondary Diagnoses: Principal Problem:   Bipolar I disorder, most recent episode depressed (HCC) Active Problems:   PTSD (post-traumatic stress disorder)   Panic disorder   Current Medications:  Current Facility-Administered Medications  Medication Dose Route Frequency Provider Last Rate Last Dose  . alum & mag hydroxide-simeth (MAALOX/MYLANTA) 200-200-20 MG/5ML suspension 30 mL  30 mL Oral Q4H PRN Jackelyn Poling, NP   30 mL at 01/18/17 0201  . ARIPiprazole (ABILIFY) tablet 2 mg  2 mg Oral Daily Craige Cotta, MD   2 mg at 01/18/17 0806  . cyclobenzaprine (FLEXERIL) tablet 5 mg  5 mg Oral TID PRN Jomarie Longs, MD   5 mg at 01/16/17 1557  . escitalopram (LEXAPRO) tablet 5 mg  5 mg Oral Daily Craige Cotta, MD   5 mg at 01/18/17 0806  . eszopiclone (LUNESTA) tablet 2 mg  2 mg Oral QHS Saramma Eappen, MD   2 mg at 01/17/17 2106  . hydrOXYzine (ATARAX/VISTARIL) tablet 25 mg  25 mg Oral TID PRN Jackelyn Poling, NP   25 mg at 01/17/17 2106  . ibuprofen (ADVIL,MOTRIN) tablet 400 mg  400 mg Oral BID PRN Jomarie Longs, MD   400 mg at 01/18/17 0605  . LORazepam (ATIVAN) tablet 0.5 mg  0.5 mg Oral Q6H PRN Craige Cotta, MD   0.5 mg at 01/17/17 1935  . nicotine (NICODERM CQ - dosed in mg/24 hours) patch 21 mg  21 mg Transdermal Daily Craige Cotta, MD   21 mg at 01/18/17 0807  . ondansetron (ZOFRAN-ODT) disintegrating tablet 4 mg  4 mg Oral Q6H PRN Sanjuana Kava, NP   4 mg at 01/18/17 0201  . pantoprazole (PROTONIX) EC tablet 40 mg  40 mg Oral Daily Sanjuana Kava, NP   40 mg at 01/18/17 4098    PTA Medications: No prescriptions prior to admission.    Treatment Modalities: Medication Management, Group therapy, Case management,  1 to 1 session with clinician, Psychoeducation, Recreational  therapy.  Patient Stressors: Loss of custody of 5 children Marital or family conflict Occupational concerns Patient Strengths: Capable of independent living Wellsite geologist fund of knowledge Motivation for treatment/growth  Physician Treatment Plan for Primary Diagnosis: Bipolar Disorder , Depressed,  Panic Disorder  Long Term Goal(s): Improvement in symptoms so as ready for discharge Short Term Goals: Ability to verbalize feelings will improve Ability to disclose and discuss suicidal ideas Ability to demonstrate self-control will improve Ability to identify and develop effective coping behaviors will improve Ability to maintain clinical measurements within normal limits will improve Ability to verbalize feelings will improve Ability to disclose and discuss suicidal ideas Ability to demonstrate self-control will improve Ability to identify and develop effective coping behaviors will improve Ability to maintain clinical measurements within normal limits will improve  Medication Management: Evaluate patient's response, side effects, and tolerance of medication regimen.  Therapeutic Interventions: 1 to 1 sessions, Unit Group sessions and Medication administration.  Evaluation of Outcomes: Adequate for Discharge  Physician Treatment Plan for Secondary Diagnosis: Principal Problem:   Bipolar I disorder, most recent episode depressed (HCC) Active Problems:   PTSD (post-traumatic stress disorder)   Panic disorder  Long Term Goal(s): Improvement in symptoms so as ready for discharge  Short Term Goals: Ability to verbalize feelings will improve Ability to disclose  and discuss suicidal ideas Ability to demonstrate self-control will improve Ability to identify and develop effective coping behaviors will improve Ability to maintain clinical measurements within normal limits will improve Ability to verbalize feelings will improve Ability to disclose and discuss suicidal  ideas Ability to demonstrate self-control will improve Ability to identify and develop effective coping behaviors will improve Ability to maintain clinical measurements within normal limits will improve  Medication Management: Evaluate patient's response, side effects, and tolerance of medication regimen.  Therapeutic Interventions: 1 to 1 sessions, Unit Group sessions and Medication administration.  Evaluation of Outcomes: Adequate for Discharge  RN Treatment Plan for Primary Diagnosis: Bipolar Disorder , Depressed,  Panic Disorder  Long Term Goal(s): Knowledge of disease and therapeutic regimen to maintain health will improve  Short Term Goals: Ability to remain free from injury will improve and Compliance with prescribed medications will improve  Medication Management: RN will administer medications as ordered by provider, will assess and evaluate patient's response and provide education to patient for prescribed medication. RN will report any adverse and/or side effects to prescribing provider.  Therapeutic Interventions: 1 on 1 counseling sessions, Psychoeducation, Medication administration, Evaluate responses to treatment, Monitor vital signs and CBGs as ordered, Perform/monitor CIWA, COWS, AIMS and Fall Risk screenings as ordered, Perform wound care treatments as ordered.  Evaluation of Outcomes: Adequate for Discharge  LCSW Treatment Plan for Primary Diagnosis: Bipolar Disorder , Depressed,  Panic Disorder  Long Term Goal(s): Safe transition to appropriate next level of care at discharge, Engage patient in therapeutic group addressing interpersonal concerns. Short Term Goals: Engage patient in aftercare planning with referrals and resources, Increase ability to appropriately verbalize feelings, Identify triggers associated with mental health/substance abuse issues and Increase skills for wellness and recovery  Therapeutic Interventions: Assess for all discharge needs, 1 to 1 time  with Social worker, Explore available resources and support systems, Assess for adequacy in community support network, Educate family and significant other(s) on suicide prevention, Complete Psychosocial Assessment, Interpersonal group therapy.  Evaluation of Outcomes: Adequate for Discharge  Progress in Treatment: Attending groups: Intermittently  Participating in groups: Yes, when she attends Taking medication as prescribed: Yes, MD continues to assess for medication changes as needed Toleration medication: Yes, no side effects reported at this time Family/Significant other contact made: No, pt declined contact Patient understands diagnosis: Yes, AEB pt's willingness to participcate in treatment. Discussing patient identified problems/goals with staff: Yes Medical problems stabilized or resolved: Yes Denies suicidal/homicidal ideation: Yes Issues/concerns per patient self-inventory: None Other: N/A  New problem(s) identified: None identified at this time.   New Short Term/Long Term Goal(s): None identified at this time.   Discharge Plan or Barriers: Pt will return home and follow up outpatient with [facility].  Reason for Continuation of Hospitalization:  None identified at this time.  Estimated Length of Stay: 0 days  Attendees: Patient: 01/18/2017 10:22 AM  Physician: Dr. Jama Flavors 01/18/2017 10:22 AM  Nursing: Marton Redwood RN; [place], RN 01/18/2017 10:22 AM  RN Care Manager: Onnie Boer, RN 01/18/2017 10:22 AM  Social Worker: Donnelly Stager, LCSWA 01/18/2017 10:22 AM  Recreational Therapist:  01/18/2017 10:22 AM  Other: Armandina Stammer, NP 01/18/2017 10:22 AM  Other:  01/18/2017 10:22 AM  Other: 01/18/2017 10:22 AM   Scribe for Treatment Team: Jonathon Jordan, MSW,LCSWA 01/18/2017 10:22 AM\

## 2017-01-18 NOTE — Progress Notes (Signed)
  [facility] Adult Case Management Discharge Plan :  Will you be returning to the same living situation after discharge:  Yes,  pt returning home. At discharge, do you have transportation home?: Yes,  bus pases provided. Do you have the ability to pay for your medications: Yes,  pt has insurance.  Release of information consent forms completed and in the chart;  Patient's signature needed at discharge.  Patient to Follow up at: Follow-up Information    [facility] [employer]. Go on 01/19/2017.   Why:  Hospital follow-up appointment at 12:15PM. Please bring your insurance card, photo ID, and proof of income to the appointment. Contact information: [address] [place] [address] [phone]           Next level of care provider has access to [facility] Link:no  Safety Planning and Suicide Prevention discussed: Yes,  with pt.  Have you used any form of tobacco in the last 30 days? (Cigarettes, Smokeless Tobacco, Cigars, and/or Pipes): Yes  Has patient been referred to the Quitline?: Patient refused referral  Patient has been referred for addiction treatment: Yes  Jonathon Jordan, MSW, LCSWA  01/18/2017, 10:33 AM
